# Patient Record
Sex: Male | Born: 1945 | Race: Black or African American | Hispanic: No
Health system: Midwestern US, Academic
[De-identification: ages and names within clinical notes are randomized; demographics above are authoritative.]

---

## 2016-02-12 IMAGING — CR CHEST
1 series · 1 of 1 positions shown · non-contrast
Comparison: none

[chest port x-wise]
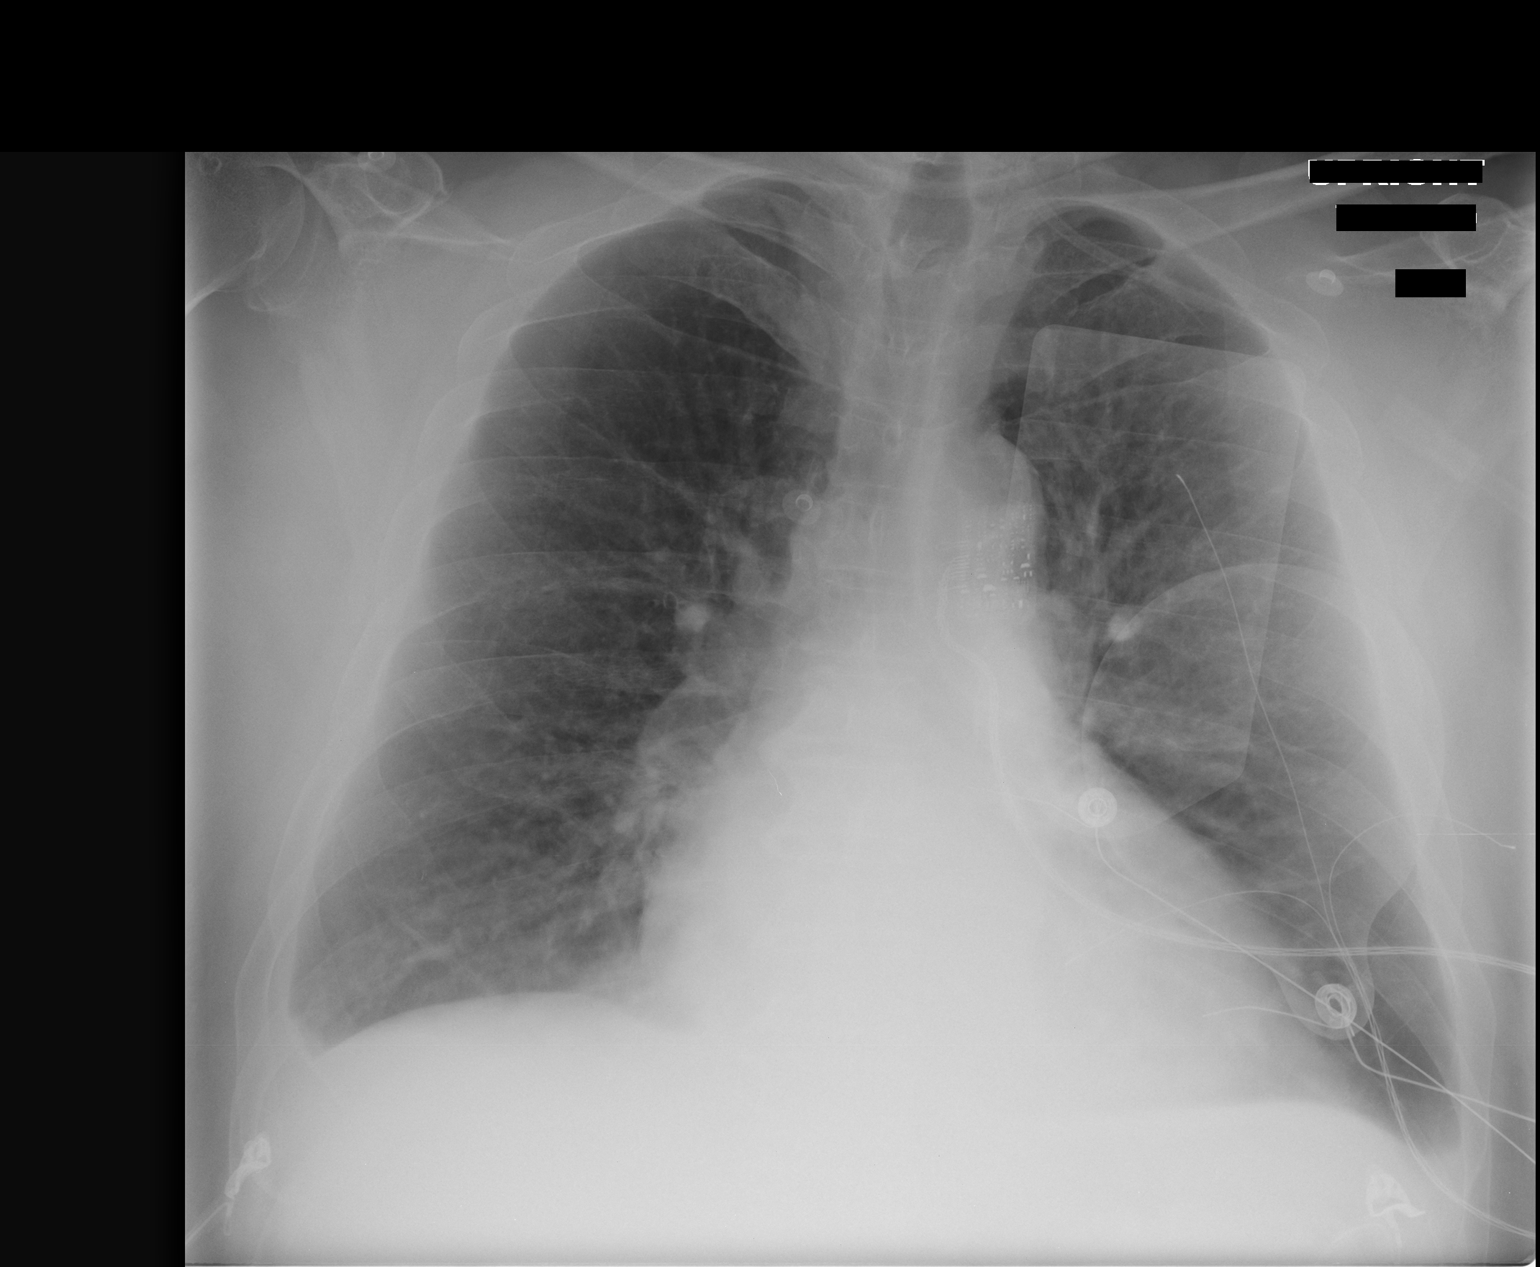

[1 of 1 positions shown; findings below may reference images not displayed]

DIAGNOSTIC STUDIES

EXAM
SINGLE VIEW CHEST

INDICATION
Vtach, Pleural effusions
AED PADS ON PT. VTACH. PLEURAL EFFUSION. TM

TECHNIQUE
Single frontal view

COMPARISON
Yesterday's chest radiographs

FINDINGS
Cardiomediastinal silhouette and pulmonary vasculature are unchanged. Pacer pads overlie the left
hemi thorax. Small bilateral pleural effusions are unchanged. No pneumothorax is identified.

IMPRESSION
Unchanged appearance of the chest including small bilateral pleural effusions with adjacent
atelectasis and/or pneumonitis.

## 2017-03-02 MED ORDER — FUROSEMIDE 20 MG PO TAB
ORAL_TABLET | ORAL | 3 refills | 90.00000 days | Status: DC
Start: 2017-03-02 — End: 2018-04-18

## 2017-03-15 MED ORDER — CARVEDILOL 25 MG PO TAB
ORAL_TABLET | Freq: Two times a day (BID) | ORAL | 3 refills | 90.00000 days | Status: DC
Start: 2017-03-15 — End: 2018-03-23

## 2017-04-06 ENCOUNTER — Ambulatory Visit: Admit: 2017-04-06 | Discharge: 2017-04-07 | Payer: MEDICARE

## 2017-04-06 ENCOUNTER — Encounter: Admit: 2017-04-06 | Discharge: 2017-04-06 | Payer: MEDICARE

## 2017-04-06 DIAGNOSIS — I472 Ventricular tachycardia: Principal | ICD-10-CM

## 2017-04-06 DIAGNOSIS — E119 Type 2 diabetes mellitus without complications: ICD-10-CM

## 2017-04-06 DIAGNOSIS — M1990 Unspecified osteoarthritis, unspecified site: ICD-10-CM

## 2017-04-06 DIAGNOSIS — K219 Gastro-esophageal reflux disease without esophagitis: ICD-10-CM

## 2017-04-06 DIAGNOSIS — I2510 Atherosclerotic heart disease of native coronary artery without angina pectoris: ICD-10-CM

## 2017-04-06 DIAGNOSIS — I10 Essential (primary) hypertension: Principal | ICD-10-CM

## 2017-04-06 DIAGNOSIS — I509 Heart failure, unspecified: ICD-10-CM

## 2017-04-06 DIAGNOSIS — N400 Enlarged prostate without lower urinary tract symptoms: ICD-10-CM

## 2017-04-06 DIAGNOSIS — E7800 Pure hypercholesterolemia, unspecified: ICD-10-CM

## 2017-04-06 DIAGNOSIS — Z95810 Presence of automatic (implantable) cardiac defibrillator: ICD-10-CM

## 2017-04-06 DIAGNOSIS — G4733 Obstructive sleep apnea (adult) (pediatric): ICD-10-CM

## 2017-04-06 DIAGNOSIS — Z720 Tobacco use: ICD-10-CM

## 2017-04-06 DIAGNOSIS — J449 Chronic obstructive pulmonary disease, unspecified: ICD-10-CM

## 2017-04-06 DIAGNOSIS — E118 Type 2 diabetes mellitus with unspecified complications: ICD-10-CM

## 2017-04-06 DIAGNOSIS — B20 Human immunodeficiency virus [HIV] disease: ICD-10-CM

## 2017-04-14 ENCOUNTER — Encounter: Admit: 2017-04-14 | Discharge: 2017-04-14 | Payer: MEDICARE

## 2017-04-14 MED ORDER — SPIRONOLACTONE 25 MG PO TAB
ORAL_TABLET | Freq: Every day | ORAL | 3 refills | 90.00000 days | Status: AC
Start: 2017-04-14 — End: 2018-04-11

## 2017-05-23 LAB — COMPREHENSIVE METABOLIC PANEL
Lab: 1
Lab: 14
Lab: 141
Lab: 15 — ABNORMAL HIGH (ref 0–14)
Lab: 24
Lab: 3.9
Lab: 51
Lab: 58
Lab: 7.8
Lab: 87

## 2017-05-23 LAB — LIPID PROFILE
Lab: 114 — ABNORMAL LOW (ref 150–200)
Lab: 28 — ABNORMAL HIGH (ref 0.72–1.25)
Lab: 3
Lab: 50 — ABNORMAL LOW (ref 27.0–31.0)

## 2017-05-23 LAB — THYROID STIMULATING HORMONE-TSH: Lab: 1 — ABNORMAL HIGH (ref 98–107)

## 2017-05-23 LAB — CBC: Lab: 6.6

## 2017-05-23 LAB — HEMOGLOBIN A1C: Lab: 10 — ABNORMAL HIGH (ref 4.5–6.5)

## 2017-06-02 ENCOUNTER — Ambulatory Visit: Admit: 2017-06-02 | Discharge: 2017-06-03 | Payer: MEDICARE

## 2017-06-03 DIAGNOSIS — Z95810 Presence of automatic (implantable) cardiac defibrillator: ICD-10-CM

## 2017-06-03 DIAGNOSIS — I472 Ventricular tachycardia: Principal | ICD-10-CM

## 2017-06-07 ENCOUNTER — Encounter: Admit: 2017-06-07 | Discharge: 2017-06-07 | Payer: MEDICARE

## 2017-06-07 DIAGNOSIS — Z95810 Presence of automatic (implantable) cardiac defibrillator: ICD-10-CM

## 2017-06-07 DIAGNOSIS — I472 Ventricular tachycardia: Principal | ICD-10-CM

## 2017-09-01 ENCOUNTER — Ambulatory Visit: Admit: 2017-09-01 | Discharge: 2017-09-02 | Payer: MEDICARE

## 2017-09-02 DIAGNOSIS — Z95810 Presence of automatic (implantable) cardiac defibrillator: ICD-10-CM

## 2017-09-02 DIAGNOSIS — I472 Ventricular tachycardia: Principal | ICD-10-CM

## 2017-09-13 ENCOUNTER — Encounter: Admit: 2017-09-13 | Discharge: 2017-09-13 | Payer: MEDICARE

## 2017-09-13 NOTE — Telephone Encounter
-----   Message from Foye Deer, [place] sent at 09/13/2017 12:29 PM CST -----  Regarding: YMR - Corvue shows possible fluid overload.   [09/13/2017 12:28:45 PM - WISE, BRANDON]  Please see scanned data sheets for further review.  Scheduled MERLIN transmission received for  dual chamber ICD. Device function appears appropriate.    Presenting EGM shows APVS 60 bpm on 09/01/17. Battery longevity 5.7-6.9 years.    Events noted since 04/06/17:  Atrial:  None.     Ventricular:  None.    CorVue shows possible fluid accumulation and/or possible early heart failure.    Next follow up appt 09/2017.  Next remote scheduled for 3 mo.   Results routed to Dr. Reece Levy for signature and review.

## 2017-09-13 NOTE — Telephone Encounter
Received alert for possible fluid accumulation (see note below). No recent episodes of VT.    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 Patient had improved EF from 20% to 45% in June 2017.     Patient is on Coreg 25mg  BID, lasix 20mg  M/W/F, Losartan 25mg , and spironolactone 12.5mg  tab. Patient states he has been compliant with medications. 02/08/17 K+ was 4.1.    The patient denies any associated symptoms including SOB, chest pain, ankle swelling, or fatigue. The patient states he does not have a scale at home so is unsure of his current weight. The patient was advised to buy a scale for home and monitor weights daily. Educated that a gain of 3lbs in one day or 5lbs a week is an indicator that he's holding on to fluid. The patient stated understanding.    Routed to [facility] for review.

## 2017-09-15 NOTE — Telephone Encounter
Reviewed with YMR. Patient is asymptomatic, no concerns or recommendations at this time.

## 2017-09-18 ENCOUNTER — Encounter: Admit: 2017-09-18 | Discharge: 2017-09-18 | Payer: MEDICARE

## 2017-09-19 MED ORDER — ATORVASTATIN 40 MG PO TAB
ORAL_TABLET | Freq: Every day | 3 refills | Status: AC
Start: 2017-09-19 — End: 2018-07-19

## 2017-11-10 ENCOUNTER — Encounter: Admit: 2017-11-10 | Discharge: 2017-11-10 | Payer: MEDICARE

## 2017-11-11 MED ORDER — LOSARTAN 25 MG PO TAB
ORAL_TABLET | Freq: Every day | ORAL | 3 refills | 30.00000 days | Status: AC
Start: 2017-11-11 — End: 2018-10-05

## 2017-11-30 ENCOUNTER — Encounter: Admit: 2017-11-30 | Discharge: 2017-11-30 | Payer: MEDICARE

## 2017-12-02 ENCOUNTER — Ambulatory Visit: Admit: 2017-12-02 | Discharge: 2017-12-03 | Payer: MEDICARE

## 2017-12-03 DIAGNOSIS — I472 Ventricular tachycardia: Principal | ICD-10-CM

## 2017-12-03 DIAGNOSIS — Z95810 Presence of automatic (implantable) cardiac defibrillator: ICD-10-CM

## 2017-12-13 ENCOUNTER — Ambulatory Visit: Admit: 2017-12-13 | Discharge: 2017-12-14 | Payer: MEDICARE

## 2017-12-13 ENCOUNTER — Encounter: Admit: 2017-12-13 | Discharge: 2017-12-13 | Payer: MEDICARE

## 2017-12-13 DIAGNOSIS — B20 Human immunodeficiency virus [HIV] disease: ICD-10-CM

## 2017-12-13 DIAGNOSIS — Z95810 Presence of automatic (implantable) cardiac defibrillator: ICD-10-CM

## 2017-12-13 DIAGNOSIS — I509 Heart failure, unspecified: ICD-10-CM

## 2017-12-13 DIAGNOSIS — G4733 Obstructive sleep apnea (adult) (pediatric): ICD-10-CM

## 2017-12-13 DIAGNOSIS — M1990 Unspecified osteoarthritis, unspecified site: ICD-10-CM

## 2017-12-13 DIAGNOSIS — K219 Gastro-esophageal reflux disease without esophagitis: ICD-10-CM

## 2017-12-13 DIAGNOSIS — N183 Chronic kidney disease, stage 3 (moderate): ICD-10-CM

## 2017-12-13 DIAGNOSIS — I5042 Chronic combined systolic (congestive) and diastolic (congestive) heart failure: ICD-10-CM

## 2017-12-13 DIAGNOSIS — I10 Essential (primary) hypertension: ICD-10-CM

## 2017-12-13 DIAGNOSIS — I2510 Atherosclerotic heart disease of native coronary artery without angina pectoris: Principal | ICD-10-CM

## 2017-12-13 DIAGNOSIS — J449 Chronic obstructive pulmonary disease, unspecified: ICD-10-CM

## 2017-12-13 DIAGNOSIS — E119 Type 2 diabetes mellitus without complications: ICD-10-CM

## 2017-12-13 DIAGNOSIS — N400 Enlarged prostate without lower urinary tract symptoms: ICD-10-CM

## 2017-12-13 LAB — BASIC METABOLIC PANEL
Lab: 1.6 — ABNORMAL HIGH (ref 0.72–1.25)
Lab: 10 mL/min — ABNORMAL HIGH (ref 8.8–10)
Lab: 109 % — ABNORMAL HIGH (ref 98–107)
Lab: 138 % (ref 48–68)
Lab: 14 % (ref 9–21)
Lab: 16 % — ABNORMAL LOW (ref 23–31)
Lab: 4.3 % (ref 2–6)
Lab: 96 mL/min (ref >60)

## 2017-12-16 ENCOUNTER — Encounter: Admit: 2017-12-16 | Discharge: 2017-12-16 | Payer: MEDICARE

## 2017-12-16 DIAGNOSIS — I10 Essential (primary) hypertension: Principal | ICD-10-CM

## 2017-12-16 DIAGNOSIS — I5042 Chronic combined systolic (congestive) and diastolic (congestive) heart failure: ICD-10-CM

## 2017-12-20 LAB — BASIC METABOLIC PANEL
Lab: 109 MMOL/L — ABNORMAL HIGH (ref 98–107)
Lab: 140 U/L (ref 25–110)
Lab: 4.3 U/L (ref 7–40)

## 2017-12-21 ENCOUNTER — Encounter: Admit: 2017-12-21 | Discharge: 2017-12-21 | Payer: MEDICARE

## 2017-12-21 DIAGNOSIS — I10 Essential (primary) hypertension: Principal | ICD-10-CM

## 2017-12-21 DIAGNOSIS — I5042 Chronic combined systolic (congestive) and diastolic (congestive) heart failure: ICD-10-CM

## 2018-01-05 ENCOUNTER — Encounter: Admit: 2018-01-05 | Discharge: 2018-01-05 | Payer: MEDICARE

## 2018-01-05 DIAGNOSIS — I10 Essential (primary) hypertension: Principal | ICD-10-CM

## 2018-01-05 DIAGNOSIS — N183 Chronic kidney disease, stage 3 (moderate): ICD-10-CM

## 2018-01-06 LAB — BASIC METABOLIC PANEL
Lab: 10 % — ABNORMAL HIGH (ref 8.8–10.0)
Lab: 106 FL (ref 7–11)
Lab: 140 % (ref 11–15)
Lab: 16 % — ABNORMAL LOW (ref 24–44)
Lab: 18 10*3/uL — ABNORMAL HIGH (ref 0–14)
Lab: 20 % — ABNORMAL LOW (ref 23–31)
Lab: 4.4 K/UL (ref 150–400)
Lab: 86 % — ABNORMAL LOW (ref >60)

## 2018-01-09 ENCOUNTER — Encounter: Admit: 2018-01-09 | Discharge: 2018-01-09 | Payer: MEDICARE

## 2018-01-09 DIAGNOSIS — N183 Chronic kidney disease, stage 3 (moderate): ICD-10-CM

## 2018-01-09 DIAGNOSIS — I10 Essential (primary) hypertension: Principal | ICD-10-CM

## 2018-01-19 ENCOUNTER — Encounter: Admit: 2018-01-19 | Discharge: 2018-01-19 | Payer: MEDICARE

## 2018-01-19 DIAGNOSIS — G4733 Obstructive sleep apnea (adult) (pediatric): ICD-10-CM

## 2018-01-19 DIAGNOSIS — N400 Enlarged prostate without lower urinary tract symptoms: ICD-10-CM

## 2018-01-19 DIAGNOSIS — J449 Chronic obstructive pulmonary disease, unspecified: ICD-10-CM

## 2018-01-19 DIAGNOSIS — I509 Heart failure, unspecified: ICD-10-CM

## 2018-01-19 DIAGNOSIS — M1990 Unspecified osteoarthritis, unspecified site: ICD-10-CM

## 2018-01-19 DIAGNOSIS — B20 Human immunodeficiency virus [HIV] disease: ICD-10-CM

## 2018-01-19 DIAGNOSIS — K219 Gastro-esophageal reflux disease without esophagitis: ICD-10-CM

## 2018-01-19 DIAGNOSIS — E119 Type 2 diabetes mellitus without complications: ICD-10-CM

## 2018-01-19 DIAGNOSIS — I10 Essential (primary) hypertension: Principal | ICD-10-CM

## 2018-02-28 ENCOUNTER — Encounter: Admit: 2018-02-28 | Discharge: 2018-02-28 | Payer: MEDICARE

## 2018-03-03 ENCOUNTER — Ambulatory Visit: Admit: 2018-03-03 | Discharge: 2018-03-04 | Payer: MEDICARE

## 2018-03-04 DIAGNOSIS — I472 Ventricular tachycardia: Principal | ICD-10-CM

## 2018-03-04 DIAGNOSIS — Z95810 Presence of automatic (implantable) cardiac defibrillator: ICD-10-CM

## 2018-03-09 ENCOUNTER — Encounter: Admit: 2018-03-09 | Discharge: 2018-03-09 | Payer: MEDICARE

## 2018-03-14 ENCOUNTER — Encounter: Admit: 2018-03-14 | Discharge: 2018-03-14 | Payer: MEDICARE

## 2018-03-14 ENCOUNTER — Ambulatory Visit: Admit: 2018-03-14 | Discharge: 2018-03-15 | Payer: MEDICARE

## 2018-03-14 ENCOUNTER — Ambulatory Visit: Admit: 2018-03-14 | Discharge: 2018-03-14 | Payer: MEDICARE

## 2018-03-14 DIAGNOSIS — I10 Essential (primary) hypertension: Principal | ICD-10-CM

## 2018-03-14 DIAGNOSIS — I5042 Chronic combined systolic (congestive) and diastolic (congestive) heart failure: ICD-10-CM

## 2018-03-14 DIAGNOSIS — I509 Heart failure, unspecified: ICD-10-CM

## 2018-03-14 DIAGNOSIS — M1990 Unspecified osteoarthritis, unspecified site: ICD-10-CM

## 2018-03-14 DIAGNOSIS — E119 Type 2 diabetes mellitus without complications: ICD-10-CM

## 2018-03-14 DIAGNOSIS — G4733 Obstructive sleep apnea (adult) (pediatric): ICD-10-CM

## 2018-03-14 DIAGNOSIS — N183 Chronic kidney disease, stage 3 (moderate): ICD-10-CM

## 2018-03-14 DIAGNOSIS — B20 Human immunodeficiency virus [HIV] disease: ICD-10-CM

## 2018-03-14 DIAGNOSIS — J449 Chronic obstructive pulmonary disease, unspecified: ICD-10-CM

## 2018-03-14 DIAGNOSIS — I472 Ventricular tachycardia: ICD-10-CM

## 2018-03-14 DIAGNOSIS — Z95810 Presence of automatic (implantable) cardiac defibrillator: ICD-10-CM

## 2018-03-14 DIAGNOSIS — E7800 Pure hypercholesterolemia, unspecified: ICD-10-CM

## 2018-03-14 DIAGNOSIS — K219 Gastro-esophageal reflux disease without esophagitis: ICD-10-CM

## 2018-03-14 DIAGNOSIS — N400 Enlarged prostate without lower urinary tract symptoms: ICD-10-CM

## 2018-03-22 ENCOUNTER — Ambulatory Visit: Admit: 2018-03-22 | Discharge: 2018-03-23 | Payer: MEDICARE

## 2018-03-22 DIAGNOSIS — I10 Essential (primary) hypertension: Principal | ICD-10-CM

## 2018-03-22 DIAGNOSIS — I472 Ventricular tachycardia: ICD-10-CM

## 2018-03-23 ENCOUNTER — Encounter: Admit: 2018-03-23 | Discharge: 2018-03-23 | Payer: MEDICARE

## 2018-03-23 MED ORDER — CARVEDILOL 25 MG PO TAB
ORAL_TABLET | Freq: Two times a day (BID) | ORAL | 3 refills | 90.00000 days | Status: AC
Start: 2018-03-23 — End: 2019-03-26

## 2018-04-11 ENCOUNTER — Encounter: Admit: 2018-04-11 | Discharge: 2018-04-11 | Payer: MEDICARE

## 2018-04-11 ENCOUNTER — Ambulatory Visit: Admit: 2018-04-11 | Discharge: 2018-04-12 | Payer: MEDICARE

## 2018-04-11 DIAGNOSIS — K219 Gastro-esophageal reflux disease without esophagitis: ICD-10-CM

## 2018-04-11 DIAGNOSIS — I472 Ventricular tachycardia: ICD-10-CM

## 2018-04-11 DIAGNOSIS — I10 Essential (primary) hypertension: Principal | ICD-10-CM

## 2018-04-11 DIAGNOSIS — B20 Human immunodeficiency virus [HIV] disease: ICD-10-CM

## 2018-04-11 DIAGNOSIS — J449 Chronic obstructive pulmonary disease, unspecified: ICD-10-CM

## 2018-04-11 DIAGNOSIS — I5042 Chronic combined systolic (congestive) and diastolic (congestive) heart failure: Principal | ICD-10-CM

## 2018-04-11 DIAGNOSIS — N400 Enlarged prostate without lower urinary tract symptoms: ICD-10-CM

## 2018-04-11 DIAGNOSIS — Z95810 Presence of automatic (implantable) cardiac defibrillator: ICD-10-CM

## 2018-04-11 DIAGNOSIS — E119 Type 2 diabetes mellitus without complications: ICD-10-CM

## 2018-04-11 DIAGNOSIS — I509 Heart failure, unspecified: ICD-10-CM

## 2018-04-11 DIAGNOSIS — M1990 Unspecified osteoarthritis, unspecified site: ICD-10-CM

## 2018-04-11 DIAGNOSIS — G4733 Obstructive sleep apnea (adult) (pediatric): ICD-10-CM

## 2018-04-11 MED ORDER — SPIRONOLACTONE 25 MG PO TAB
ORAL_TABLET | Freq: Every day | ORAL | 3 refills | 46.00000 days | Status: AC
Start: 2018-04-11 — End: 2018-10-05

## 2018-04-18 ENCOUNTER — Encounter: Admit: 2018-04-18 | Discharge: 2018-04-18 | Payer: MEDICARE

## 2018-04-18 MED ORDER — FUROSEMIDE 20 MG PO TAB
ORAL_TABLET | ORAL | 3 refills | 90.00000 days | Status: AC
Start: 2018-04-18 — End: 2018-10-05

## 2018-06-06 ENCOUNTER — Ambulatory Visit: Admit: 2018-06-06 | Discharge: 2018-06-07 | Payer: MEDICARE

## 2018-06-06 ENCOUNTER — Encounter: Admit: 2018-06-06 | Discharge: 2018-06-06 | Payer: MEDICARE

## 2018-06-06 DIAGNOSIS — Z95810 Presence of automatic (implantable) cardiac defibrillator: ICD-10-CM

## 2018-06-06 DIAGNOSIS — I5042 Chronic combined systolic (congestive) and diastolic (congestive) heart failure: Principal | ICD-10-CM

## 2018-06-06 DIAGNOSIS — I472 Ventricular tachycardia: ICD-10-CM

## 2018-06-07 DIAGNOSIS — I5042 Chronic combined systolic (congestive) and diastolic (congestive) heart failure: Principal | ICD-10-CM

## 2018-06-07 DIAGNOSIS — I472 Ventricular tachycardia: ICD-10-CM

## 2018-07-19 ENCOUNTER — Encounter: Admit: 2018-07-19 | Discharge: 2018-07-19 | Payer: MEDICARE

## 2018-07-19 DIAGNOSIS — E7800 Pure hypercholesterolemia, unspecified: Principal | ICD-10-CM

## 2018-07-19 DIAGNOSIS — I2510 Atherosclerotic heart disease of native coronary artery without angina pectoris: ICD-10-CM

## 2018-07-19 MED ORDER — ATORVASTATIN 40 MG PO TAB
ORAL_TABLET | Freq: Every day | 3 refills | Status: SS
Start: 2018-07-19 — End: 2019-06-19

## 2018-07-28 ENCOUNTER — Encounter: Admit: 2018-07-28 | Discharge: 2018-07-28 | Payer: MEDICARE

## 2018-07-28 DIAGNOSIS — E7800 Pure hypercholesterolemia, unspecified: Principal | ICD-10-CM

## 2018-07-28 DIAGNOSIS — I2510 Atherosclerotic heart disease of native coronary artery without angina pectoris: ICD-10-CM

## 2018-07-28 LAB — LIPID PROFILE
Lab: 19
Lab: 111 — ABNORMAL LOW (ref 150–200)
Lab: 3
Lab: 38
Lab: 52
Lab: 95

## 2018-07-28 LAB — LIVER FUNCTION PANEL: Lab: 1.1 mg/dL — ABNORMAL HIGH (ref 0.0–1.0)

## 2018-09-05 ENCOUNTER — Ambulatory Visit: Admit: 2018-09-05 | Discharge: 2018-09-05 | Payer: MEDICARE

## 2018-09-05 DIAGNOSIS — I472 Ventricular tachycardia: Secondary | ICD-10-CM

## 2018-09-05 DIAGNOSIS — I5042 Chronic combined systolic (congestive) and diastolic (congestive) heart failure: Principal | ICD-10-CM

## 2018-09-05 DIAGNOSIS — Z95810 Presence of automatic (implantable) cardiac defibrillator: ICD-10-CM

## 2018-09-25 ENCOUNTER — Encounter: Admit: 2018-09-25 | Discharge: 2018-09-25 | Payer: MEDICARE

## 2018-09-25 DIAGNOSIS — K859 Acute pancreatitis without necrosis or infection, unspecified: Secondary | ICD-10-CM

## 2018-09-25 LAB — POC GLUCOSE: Lab: 162 mg/dL — ABNORMAL HIGH (ref 70–100)

## 2018-09-25 MED ORDER — LACTATED RINGERS IV SOLP
1000 mL | Freq: Once | INTRAVENOUS | 0 refills | Status: CP
Start: 2018-09-25 — End: ?
  Administered 2018-09-26: 01:00:00 1000 mL via INTRAVENOUS

## 2018-09-25 MED ORDER — LACTATED RINGERS IV SOLP
1000 mL | INTRAVENOUS | 0 refills | Status: DC
Start: 2018-09-25 — End: 2018-09-26
  Administered 2018-09-26: 08:00:00 1000 mL via INTRAVENOUS

## 2018-09-25 MED ORDER — FAMOTIDINE (PF) 20 MG/2 ML IV SOLN
20 mg | Freq: Two times a day (BID) | INTRAVENOUS | 0 refills | Status: DC
Start: 2018-09-25 — End: 2018-09-26
  Administered 2018-09-26 (×2): 20 mg via INTRAVENOUS

## 2018-09-25 MED ORDER — FENTANYL CITRATE (PF) 50 MCG/ML IJ SOLN
25 ug | INTRAVENOUS | 0 refills | Status: DC | PRN
Start: 2018-09-25 — End: 2018-09-30
  Administered 2018-09-26 – 2018-09-30 (×3): 25 ug via INTRAVENOUS

## 2018-09-25 MED ORDER — BISACODYL 10 MG RE SUPP
10 mg | Freq: Every day | RECTAL | 0 refills | Status: DC | PRN
Start: 2018-09-25 — End: 2018-10-05
  Administered 2018-10-01 (×2): 10 mg via RECTAL

## 2018-09-25 MED ORDER — INSULIN ASPART 100 UNIT/ML SC FLEXPEN
0-12 [IU] | Freq: Before meals | SUBCUTANEOUS | 0 refills | Status: DC
Start: 2018-09-25 — End: 2018-10-05
  Administered 2018-09-27: 17:00:00 2 [IU] via SUBCUTANEOUS

## 2018-09-26 ENCOUNTER — Encounter: Admit: 2018-09-26 | Discharge: 2018-09-26 | Payer: MEDICARE

## 2018-09-26 ENCOUNTER — Inpatient Hospital Stay: Admit: 2018-09-26 | Discharge: 2018-09-26 | Payer: MEDICARE

## 2018-09-26 ENCOUNTER — Encounter: Admit: 2018-09-26 | Discharge: 2018-09-27 | Payer: MEDICARE

## 2018-09-26 DIAGNOSIS — B20 Human immunodeficiency virus [HIV] disease: ICD-10-CM

## 2018-09-26 DIAGNOSIS — R945 Abnormal results of liver function studies: ICD-10-CM

## 2018-09-26 DIAGNOSIS — N400 Enlarged prostate without lower urinary tract symptoms: ICD-10-CM

## 2018-09-26 DIAGNOSIS — M1990 Unspecified osteoarthritis, unspecified site: ICD-10-CM

## 2018-09-26 DIAGNOSIS — J449 Chronic obstructive pulmonary disease, unspecified: ICD-10-CM

## 2018-09-26 DIAGNOSIS — I10 Essential (primary) hypertension: Principal | ICD-10-CM

## 2018-09-26 DIAGNOSIS — E119 Type 2 diabetes mellitus without complications: ICD-10-CM

## 2018-09-26 DIAGNOSIS — K219 Gastro-esophageal reflux disease without esophagitis: ICD-10-CM

## 2018-09-26 DIAGNOSIS — I509 Heart failure, unspecified: ICD-10-CM

## 2018-09-26 DIAGNOSIS — G4733 Obstructive sleep apnea (adult) (pediatric): ICD-10-CM

## 2018-09-26 LAB — BASIC METABOLIC PANEL
Lab: 107 MMOL/L (ref 98–110)
Lab: 12 MMOL/L — ABNORMAL LOW (ref 21–30)
Lab: 121 mg/dL — ABNORMAL HIGH (ref 70–100)
Lab: 13 mg/dL — ABNORMAL HIGH (ref 0.4–1.24)
Lab: 135 MMOL/L — ABNORMAL LOW (ref 137–147)
Lab: 16 — ABNORMAL HIGH (ref 3–12)
Lab: 5.8 MMOL/L — ABNORMAL HIGH (ref 3.5–5.1)
Lab: 8.4 mg/dL — ABNORMAL LOW (ref 8.5–10.6)
Lab: 88 mg/dL — ABNORMAL HIGH (ref 7–25)
Lab: 135 MMOL/L — ABNORMAL LOW (ref 137–147)
Lab: 136 MMOL/L — ABNORMAL LOW (ref >60)

## 2018-09-26 LAB — COMPREHENSIVE METABOLIC PANEL
Lab: 11 MMOL/L — ABNORMAL LOW (ref 21–30)
Lab: 115 U/L — ABNORMAL HIGH (ref 25–110)
Lab: 13 mg/dL — ABNORMAL HIGH (ref 0.4–1.24)
Lab: 132 U/L — ABNORMAL HIGH (ref 7–40)
Lab: 134 MMOL/L — ABNORMAL LOW (ref 137–147)
Lab: 135 U/L — ABNORMAL HIGH (ref 7–56)
Lab: 157 mg/dL — ABNORMAL HIGH (ref 70–100)
Lab: 3.1 g/dL — ABNORMAL LOW (ref 3.5–5.0)
Lab: 4 mL/min — ABNORMAL LOW (ref >60)
Lab: 6.9 mg/dL — ABNORMAL HIGH (ref 0.3–1.2)
Lab: 7.6 g/dL (ref 6.0–8.0)
Lab: 8.8 mg/dL (ref 8.5–10.6)
Lab: 90 mg/dL — ABNORMAL HIGH (ref 7–25)
Lab: 18 — ABNORMAL HIGH (ref 3–12)
Lab: 135 MMOL/L — ABNORMAL LOW (ref <150)

## 2018-09-26 LAB — CBC AND DIFF
Lab: 1 % (ref 0–5)
Lab: 10 % (ref 4–12)
Lab: 10 10*3/uL — ABNORMAL HIGH (ref 1.8–7.0)
Lab: 10 g/dL — ABNORMAL LOW (ref 13.5–16.5)
Lab: 12 K/UL — ABNORMAL HIGH (ref 4.5–11.0)
Lab: 3.8 M/UL — ABNORMAL LOW (ref 4.4–5.5)
Lab: 83 % — ABNORMAL HIGH (ref 41–77)
Lab: 11 10*3/uL — ABNORMAL HIGH (ref 4.5–11.0)

## 2018-09-26 LAB — POC GLUCOSE
Lab: 121 mg/dL — ABNORMAL HIGH (ref 70–100)
Lab: 140 mg/dL — ABNORMAL HIGH (ref 70–100)
Lab: 147 mg/dL — ABNORMAL HIGH (ref 70–100)
Lab: 238 mg/dL — ABNORMAL HIGH (ref 70–100)

## 2018-09-26 LAB — BLOOD GASES, ARTERIAL
Lab: 25 mmHg — ABNORMAL LOW (ref 35–45)
Lab: 7.3 — ABNORMAL LOW (ref 7.35–7.45)
Lab: 87 mmHg — AB (ref 80–100)
Lab: 13 MMOL/L — ABNORMAL LOW (ref 21–28)
Lab: 14 MMOL/L
Lab: 40 mmHg (ref 35–45)
Lab: 7.1 — CL (ref 7.35–7.45)
Lab: 84 mmHg (ref 80–100)
Lab: 94 % — ABNORMAL LOW (ref 95–99)
Lab: 14 MMOL/L — ABNORMAL HIGH (ref 3.4–5.0)
Lab: 41 mmHg (ref 35–45)
Lab: 7.1 % — CL (ref 7.35–7.45)
Lab: 90 mmHg (ref 80–100)
Lab: 95 % (ref 95–99)

## 2018-09-26 LAB — POC IONIZED CALCIUM: Lab: 1.2 MMOL/L (ref 1.0–1.3)

## 2018-09-26 LAB — LIPASE
Lab: 482 U/L — ABNORMAL HIGH (ref 11–82)
Lab: 595 U/L — ABNORMAL HIGH (ref <100)

## 2018-09-26 LAB — BETA HYDROXYBUTYRATE (KETONES): Lab: 0.4 MMOL/L — ABNORMAL HIGH (ref <0.3)

## 2018-09-26 LAB — TROPONIN-I: Lab: 0 ng/mL — ABNORMAL LOW (ref 0.0–0.05)

## 2018-09-26 LAB — POC BLOOD GAS ARTERIAL
Lab: 18 MMOL/L — ABNORMAL LOW (ref 21–28)
Lab: 6.9 — CL (ref 7.35–7.45)
Lab: 85 % — ABNORMAL LOW (ref 95–99)
Lab: 88 mmHg — ABNORMAL HIGH (ref 35–45)

## 2018-09-26 LAB — CEA(CARCINOEMBRYONIC AG): Lab: 2.5 ng/mL (ref <3.0)

## 2018-09-26 LAB — LACTIC ACID (BG - RAPID LACTATE)
Lab: 0.9 MMOL/L (ref 0.5–2.0)
Lab: 0.6 MMOL/L — ABNORMAL HIGH (ref >60)

## 2018-09-26 LAB — POC HEMATOCRIT
Lab: 10 g/dL — ABNORMAL LOW (ref 13.5–16.5)
Lab: 32 % — ABNORMAL LOW (ref 40–50)

## 2018-09-26 LAB — OSMOLALITY: Lab: 320 mosm/kg — ABNORMAL HIGH (ref 280–307)

## 2018-09-26 LAB — URINALYSIS, MICROSCOPIC

## 2018-09-26 LAB — POC SODIUM: Lab: 138 MMOL/L (ref 137–147)

## 2018-09-26 LAB — PROTIME INR (PT): Lab: 1.2 (ref 0.8–1.2)

## 2018-09-26 LAB — CA19.9: Lab: 115 U/mL — ABNORMAL HIGH (ref <35)

## 2018-09-26 LAB — TRIGLYCERIDE: Lab: 287 mg/dL — ABNORMAL HIGH (ref <150)

## 2018-09-26 LAB — PROSTATIC SPECIFIC ANTIGEN-PSA: Lab: 18 ng/mL — ABNORMAL HIGH (ref <6.01)

## 2018-09-26 LAB — OSMOLALITY-URINE RANDOM: Lab: 328 mosm/kg (ref 50–1400)

## 2018-09-26 LAB — SODIUM-URINE RANDOM: Lab: 54 MMOL/L

## 2018-09-26 LAB — PTT (APTT): Lab: 33 s (ref 24.0–36.5)

## 2018-09-26 LAB — URINALYSIS DIPSTICK

## 2018-09-26 LAB — POC POTASSIUM: Lab: 5.9 MMOL/L — ABNORMAL HIGH (ref 3.5–5.1)

## 2018-09-26 MED ORDER — HEPARIN, PORCINE (PF) 5,000 UNIT/0.5 ML IJ SYRG
5000 [IU] | SUBCUTANEOUS | 0 refills | Status: DC
Start: 2018-09-26 — End: 2018-10-01
  Administered 2018-09-26 – 2018-10-01 (×14): 5000 [IU] via SUBCUTANEOUS

## 2018-09-26 MED ORDER — SODIUM BICARBONATE 1MEQ/ML IVPB
50 meq | Freq: Once | INTRAVENOUS | 0 refills | Status: DC
Start: 2018-09-26 — End: 2018-09-26

## 2018-09-26 MED ORDER — DEXTROSE 50 % IN WATER (D50W) IV SOLP
50 mL | Freq: Once | INTRAVENOUS | 0 refills | Status: AC
Start: 2018-09-26 — End: ?

## 2018-09-26 MED ORDER — IOPAMIDOL 61 % 50 ML IV SOLN (OT)(OSM)
0 refills | Status: DC
Start: 2018-09-26 — End: 2018-09-26
  Administered 2018-09-26: 17:00:00 50 mL via INTRAMUSCULAR

## 2018-09-26 MED ORDER — GLYCOPYRROLATE 0.2 MG/ML IJ SOLN
0 refills | Status: DC
Start: 2018-09-26 — End: 2018-09-26
  Administered 2018-09-26: 17:00:00 0.6 mg via INTRAVENOUS

## 2018-09-26 MED ORDER — SODIUM CHLORIDE 0.9 % IV SOLP
200 mL | INTRAVENOUS | 0 refills | Status: DC | PRN
Start: 2018-09-26 — End: 2018-09-27

## 2018-09-26 MED ORDER — SODIUM CHLORIDE 0.9 % IV SOLP
1000 mL | INTRAVENOUS | 0 refills | Status: DC | PRN
Start: 2018-09-26 — End: 2018-09-27

## 2018-09-26 MED ORDER — SODIUM CHLORIDE 0.9 % IV SOLP
500 mL | Freq: Once | INTRAVENOUS | 0 refills | Status: CP
Start: 2018-09-26 — End: ?
  Administered 2018-09-26: 14:00:00 500 mL via INTRAVENOUS

## 2018-09-26 MED ORDER — PROPOFOL 10 MG/ML IV EMUL
5-150 ug/kg/min | INTRAVENOUS | 0 refills | Status: DC
Start: 2018-09-26 — End: 2018-09-27
  Administered 2018-09-26: 15 ug/kg/min via INTRAVENOUS
  Administered 2018-09-26: 18:00:00 5 ug/kg/min via INTRAVENOUS
  Administered 2018-09-27: 09:00:00 10 ug/kg/min via INTRAVENOUS
  Administered 2018-09-27: 01:00:00 15 ug/kg/min via INTRAVENOUS

## 2018-09-26 MED ORDER — ROCURONIUM 10 MG/ML IV SOLN
INTRAVENOUS | 0 refills | Status: DC
Start: 2018-09-26 — End: 2018-09-26
  Administered 2018-09-26: 17:00:00 40 mg via INTRAVENOUS

## 2018-09-26 MED ORDER — CEFEPIME 1G/100ML NS IVPB (MB+)
1 g | Freq: Once | INTRAVENOUS | 0 refills | Status: CP
Start: 2018-09-26 — End: ?
  Administered 2018-09-27 (×2): 1 g via INTRAVENOUS

## 2018-09-26 MED ORDER — SODIUM CHLORIDE 0.9 % IV SOLP
INTRAVENOUS | 0 refills | Status: DC
Start: 2018-09-26 — End: 2018-09-27

## 2018-09-26 MED ORDER — SODIUM BICARBONATE 1 MEQ/ML (8.4 %) IV SOLN
50 meq | Freq: Once | INTRAVENOUS | 0 refills | Status: DC
Start: 2018-09-26 — End: 2018-09-26

## 2018-09-26 MED ORDER — ETOMIDATE 2 MG/ML IV SOLN
20 mg | Freq: Once | INTRAVENOUS | 0 refills | Status: CP
Start: 2018-09-26 — End: ?
  Administered 2018-09-26: 18:00:00 20 mg via INTRAVENOUS

## 2018-09-26 MED ORDER — FUROSEMIDE 10 MG/ML IJ SOLN
80 mg | Freq: Once | INTRAVENOUS | 0 refills | Status: CP
Start: 2018-09-26 — End: ?
  Administered 2018-09-26: 13:00:00 80 mg via INTRAVENOUS

## 2018-09-26 MED ORDER — ONDANSETRON HCL (PF) 4 MG/2 ML IJ SOLN
INTRAVENOUS | 0 refills | Status: DC
Start: 2018-09-26 — End: 2018-09-26
  Administered 2018-09-26: 17:00:00 4 mg via INTRAVENOUS

## 2018-09-26 MED ORDER — CHLORHEXIDINE GLUCONATE 0.12 % MM MWSH
15 mL | Freq: Two times a day (BID) | ORAL | 0 refills | Status: DC
Start: 2018-09-26 — End: 2018-09-28
  Administered 2018-09-27 (×2): 15 mL via ORAL

## 2018-09-26 MED ORDER — SODIUM CHLORIDE 0.9 % IV SOLP
300 mL | INTRAVENOUS | 0 refills | Status: CP | PRN
Start: 2018-09-26 — End: ?
  Administered 2018-09-26: 23:00:00 300 mL via INTRAVENOUS

## 2018-09-26 MED ORDER — PROMETHAZINE 25 MG/ML IJ SOLN
6.25 mg | INTRAVENOUS | 0 refills | Status: DC | PRN
Start: 2018-09-26 — End: 2018-09-27

## 2018-09-26 MED ORDER — LACTATED RINGERS IV SOLP
1000 mL | Freq: Once | INTRAVENOUS | 0 refills | Status: DC
Start: 2018-09-26 — End: 2018-09-26

## 2018-09-26 MED ORDER — ANTICOAG SODIUM CITRATE SOLN 5ML
1-5 mL | Freq: Once | 0 refills | Status: CP
Start: 2018-09-26 — End: ?
  Administered 2018-09-27: 3.2 mL

## 2018-09-26 MED ORDER — INSULIN REGULAR HUMAN(#) 1 UNIT/ML IJ SYRINGE
10 [IU] | Freq: Once | INTRAVENOUS | 0 refills | Status: AC
Start: 2018-09-26 — End: ?

## 2018-09-26 MED ORDER — SODIUM BICARBONATE 1 MEQ/ML (8.4 %) IV SOLN
50 meq | Freq: Once | INTRAVENOUS | 0 refills | Status: CP
Start: 2018-09-26 — End: ?
  Administered 2018-09-26: 21:00:00 50 meq via INTRAVENOUS

## 2018-09-26 MED ORDER — DEXTRAN 70-HYPROMELLOSE (PF) 0.1-0.3 % OP DPET
0 refills | Status: DC
Start: 2018-09-26 — End: 2018-09-26
  Administered 2018-09-26: 17:00:00 2 [drp] via OPHTHALMIC

## 2018-09-26 MED ORDER — FENTANYL PCA/DRIP IN NS 1000MCG/100ML
10-100 ug/h | INTRAVENOUS | 0 refills | Status: DC
Start: 2018-09-26 — End: 2018-09-27
  Administered 2018-09-26: 10 ug/h via INTRAVENOUS

## 2018-09-26 MED ORDER — PANCRELIPASE-SODIUM BICARBONATE 20,880 K UNIT-650 MG
NASOGASTRIC | 0 refills | Status: DC | PRN
Start: 2018-09-26 — End: 2018-10-05

## 2018-09-26 MED ORDER — LIDOCAINE (PF) 200 MG/10 ML (2 %) IJ SYRG
0 refills | Status: DC
Start: 2018-09-26 — End: 2018-09-26
  Administered 2018-09-26: 17:00:00 100 mg via INTRAVENOUS

## 2018-09-26 MED ORDER — NEOSTIGMINE METHYLSULFATE 1 MG/ML IJ SOLN
0 refills | Status: DC
Start: 2018-09-26 — End: 2018-09-26
  Administered 2018-09-26: 17:00:00 4 mg via INTRAVENOUS

## 2018-09-26 MED ORDER — CEFEPIME IVPB
500 mg | INTRAVENOUS | 0 refills | Status: DC
Start: 2018-09-26 — End: 2018-09-26

## 2018-09-26 MED ORDER — FENTANYL CITRATE (PF) 50 MCG/ML IJ SOLN
0 refills | Status: DC
Start: 2018-09-26 — End: 2018-09-26
  Administered 2018-09-26: 16:00:00 50 ug via INTRAVENOUS

## 2018-09-26 MED ORDER — PHENYLEPHRINE IN 0.9% NACL(PF) 1 MG/10 ML (100 MCG/ML) IV SYRG
INTRAVENOUS | 0 refills | Status: DC
Start: 2018-09-26 — End: 2018-09-26
  Administered 2018-09-26: 17:00:00 100 ug via INTRAVENOUS
  Administered 2018-09-26: 17:00:00 50 ug via INTRAVENOUS
  Administered 2018-09-26 (×2): 100 ug via INTRAVENOUS

## 2018-09-26 MED ORDER — CEFEPIME IVPB
500 mg | INTRAVENOUS | 0 refills | Status: DC
Start: 2018-09-26 — End: 2018-09-27

## 2018-09-26 MED ORDER — SODIUM CHLORIDE 0.9 % IV SOLP
0 refills | Status: DC
Start: 2018-09-26 — End: 2018-09-26
  Administered 2018-09-26: 16:00:00 via INTRAVENOUS

## 2018-09-26 MED ORDER — ROCURONIUM 10 MG/ML IV SOLN
80 mg | Freq: Once | INTRAVENOUS | 0 refills | Status: CP
Start: 2018-09-26 — End: ?
  Administered 2018-09-26: 18:00:00 80 mg via INTRAVENOUS

## 2018-09-26 MED ORDER — IV PB BUILDER
Freq: Once | INTRAVENOUS | 0 refills | Status: CP
Start: 2018-09-26 — End: ?
  Administered 2018-09-26 (×2): 150.000 mL via INTRAVENOUS

## 2018-09-26 MED ORDER — DEXTROSE 50 % IN WATER (D50W) IV SOLP
25 g | Freq: Once | INTRAVENOUS | 0 refills | Status: CP
Start: 2018-09-26 — End: ?
  Administered 2018-09-26: 21:00:00 50 mL via INTRAVENOUS

## 2018-09-26 MED ORDER — PROPOFOL INJ 10 MG/ML IV VIAL
0 refills | Status: DC
Start: 2018-09-26 — End: 2018-09-26
  Administered 2018-09-26: 17:00:00 120 mg via INTRAVENOUS

## 2018-09-26 MED ORDER — CALCIUM GLUCONATE 1GM/100ML NS MB+
1 g | Freq: Once | INTRAVENOUS | 0 refills | Status: CP
Start: 2018-09-26 — End: ?
  Administered 2018-09-26 (×2): 1 g via INTRAVENOUS

## 2018-09-26 MED ORDER — EPHEDRINE SULFATE 50 MG/5ML SYR (10 MG/ML) (AN)(OSM)
0 refills | Status: DC
Start: 2018-09-26 — End: 2018-09-26
  Administered 2018-09-26 (×2): 10 mg via INTRAVENOUS

## 2018-09-26 MED ORDER — PANTOPRAZOLE 40 MG IV SOLR
40 mg | Freq: Every day | INTRAVENOUS | 0 refills | Status: DC
Start: 2018-09-26 — End: 2018-10-01
  Administered 2018-09-27 – 2018-10-01 (×4): 40 mg via INTRAVENOUS

## 2018-09-26 MED ORDER — FUROSEMIDE 10 MG/ML IJ SOLN
40 mg | Freq: Once | INTRAVENOUS | 0 refills | Status: DC
Start: 2018-09-26 — End: 2018-09-26

## 2018-09-26 MED ORDER — INSULIN REGULAR HUMAN(#) 1 UNIT/ML IJ SYRINGE
10 [IU] | Freq: Once | INTRAVENOUS | 0 refills | Status: CP
Start: 2018-09-26 — End: ?
  Administered 2018-09-26: 21:00:00 10 [IU] via INTRAVENOUS

## 2018-09-26 MED ORDER — FENTANYL CITRATE (PF) 50 MCG/ML IJ SOLN
50 ug | INTRAVENOUS | 0 refills | Status: DC | PRN
Start: 2018-09-26 — End: 2018-09-27

## 2018-09-27 ENCOUNTER — Encounter: Admit: 2018-09-27 | Discharge: 2018-09-27 | Payer: MEDICARE

## 2018-09-27 DIAGNOSIS — K219 Gastro-esophageal reflux disease without esophagitis: ICD-10-CM

## 2018-09-27 DIAGNOSIS — E119 Type 2 diabetes mellitus without complications: ICD-10-CM

## 2018-09-27 DIAGNOSIS — I509 Heart failure, unspecified: ICD-10-CM

## 2018-09-27 DIAGNOSIS — I10 Essential (primary) hypertension: Principal | ICD-10-CM

## 2018-09-27 DIAGNOSIS — G4733 Obstructive sleep apnea (adult) (pediatric): ICD-10-CM

## 2018-09-27 DIAGNOSIS — N400 Enlarged prostate without lower urinary tract symptoms: ICD-10-CM

## 2018-09-27 DIAGNOSIS — J449 Chronic obstructive pulmonary disease, unspecified: ICD-10-CM

## 2018-09-27 DIAGNOSIS — M1990 Unspecified osteoarthritis, unspecified site: ICD-10-CM

## 2018-09-27 LAB — BLOOD GASES, ARTERIAL
Lab: 126 mmHg — ABNORMAL HIGH (ref 80–100)
Lab: 20 MMOL/L — ABNORMAL LOW (ref 21–28)
Lab: 36 mmHg (ref 35–45)
Lab: 4.8 MMOL/L (ref 0–0.20)
Lab: 7.3 K/UL (ref 7.35–7.45)
Lab: 98 % (ref 95–99)
Lab: 110 mmHg — ABNORMAL HIGH (ref 80–100)
Lab: 19 MMOL/L — ABNORMAL LOW (ref 21–28)
Lab: 37 mmHg (ref 35–45)
Lab: 6.5 MMOL/L
Lab: 97 % (ref 95–99)
Lab: 7.3 — ABNORMAL LOW (ref 7.35–7.45)

## 2018-09-27 LAB — POC GLUCOSE
Lab: 135 mg/dL — ABNORMAL HIGH (ref 70–100)
Lab: 157 mg/dL — ABNORMAL HIGH (ref 70–100)
Lab: 190 mg/dL — ABNORMAL HIGH (ref 70–100)
Lab: 124 mg/dL — ABNORMAL HIGH (ref >60)

## 2018-09-27 LAB — HEPATITIS PANEL, ACUTE
Lab: NEGATIVE
Lab: NEGATIVE
Lab: NEGATIVE
Lab: NEGATIVE

## 2018-09-27 LAB — CBC AND DIFF
Lab: 1 % — ABNORMAL LOW (ref 24–44)
Lab: 13 10*3/uL — ABNORMAL HIGH (ref 4.5–11.0)
Lab: 16 % — ABNORMAL HIGH (ref >60)
Lab: 2 % — ABNORMAL LOW (ref 4–12)
Lab: 25 pg — ABNORMAL LOW (ref 26–34)
Lab: 272 K/UL — ABNORMAL LOW (ref >60)
Lab: 3.8 M/UL — ABNORMAL LOW (ref 4.4–5.5)
Lab: 31 % — ABNORMAL LOW (ref 40–50)
Lab: 31 g/dL — ABNORMAL LOW (ref 32.0–36.0)
Lab: 8.7 FL (ref 7–11)
Lab: 80 FL — ABNORMAL HIGH (ref 80–100)
Lab: 9.8 g/dL — ABNORMAL LOW (ref 13.5–16.5)
Lab: 96 % — ABNORMAL HIGH (ref 41–77)
Lab: 11 K/UL — ABNORMAL HIGH (ref >60)

## 2018-09-27 LAB — HIV 1& 2 AG-AB SCRN W REFLEX HIV 1 PCR QUANT: Lab: NEGATIVE

## 2018-09-27 LAB — C4 COMPLEMENT 4: Lab: 55 mg/dL — ABNORMAL HIGH (ref 10–49)

## 2018-09-27 LAB — BASIC METABOLIC PANEL
Lab: 102 MMOL/L (ref 98–110)
Lab: 138 MMOL/L (ref 137–147)
Lab: 143 mg/dL — ABNORMAL HIGH (ref 70–100)
Lab: 16 MMOL/L — ABNORMAL LOW (ref 21–30)
Lab: 4.9 MMOL/L (ref 3.5–5.1)
Lab: 68 mg/dL — ABNORMAL HIGH (ref 7–25)
Lab: 8.3 mg/dL — ABNORMAL LOW (ref 8.5–10.6)

## 2018-09-27 LAB — LIPASE: Lab: >30 U/L — ABNORMAL HIGH (ref 11–82)

## 2018-09-27 LAB — HEPATITIS B SURFACE AG: Lab: NEGATIVE

## 2018-09-27 LAB — MPO/PR3 W REFLEX TO ANCA

## 2018-09-27 LAB — PHOSPHORUS: Lab: 7.4 mg/dL — ABNORMAL HIGH (ref 2.0–4.5)

## 2018-09-27 LAB — COMPREHENSIVE METABOLIC PANEL: Lab: 138 MMOL/L — ABNORMAL LOW (ref >60)

## 2018-09-27 LAB — GLOMERULAR BASEMENT MEMBRANE ANTIBODIES

## 2018-09-27 LAB — C3 COMPLEMENT 3: Lab: 117 mg/dL (ref 88–200)

## 2018-09-27 MED ORDER — ANTICOAG SODIUM CITRATE SOLN 5ML
1-5 mL | Freq: Once | 0 refills | Status: CP
Start: 2018-09-27 — End: ?
  Administered 2018-09-27: 21:00:00 3.2 mL

## 2018-09-27 MED ORDER — CEFEPIME 1G/100ML NS IVPB (MB+)
1 g | INTRAVENOUS | 0 refills | Status: DC
Start: 2018-09-27 — End: 2018-09-29
  Administered 2018-09-27 – 2018-09-29 (×4): 1 g via INTRAVENOUS

## 2018-09-27 MED ORDER — SODIUM CHLORIDE 0.9 % IV SOLP
1000 mL | INTRAVENOUS | 0 refills | Status: DC | PRN
Start: 2018-09-27 — End: 2018-09-28

## 2018-09-27 MED ORDER — SODIUM CHLORIDE 0.9 % IV SOLP
300 mL | INTRAVENOUS | 0 refills | Status: CP | PRN
Start: 2018-09-27 — End: ?
  Administered 2018-09-27: 20:00:00 300 mL via INTRAVENOUS

## 2018-09-27 MED ORDER — SODIUM CHLORIDE 0.9 % IV SOLP
1000 mL | Freq: Once | INTRAVENOUS | 0 refills | Status: CP
Start: 2018-09-27 — End: ?
  Administered 2018-09-27: 23:00:00 1000 mL via INTRAVENOUS

## 2018-09-27 MED ORDER — SODIUM CHLORIDE 0.9 % IV SOLP
200 mL | INTRAVENOUS | 0 refills | Status: DC | PRN
Start: 2018-09-27 — End: 2018-09-28

## 2018-09-28 ENCOUNTER — Encounter: Admit: 2018-09-28 | Discharge: 2018-09-28 | Payer: MEDICARE

## 2018-09-28 DIAGNOSIS — G4733 Obstructive sleep apnea (adult) (pediatric): ICD-10-CM

## 2018-09-28 DIAGNOSIS — M1990 Unspecified osteoarthritis, unspecified site: ICD-10-CM

## 2018-09-28 DIAGNOSIS — J449 Chronic obstructive pulmonary disease, unspecified: ICD-10-CM

## 2018-09-28 DIAGNOSIS — K219 Gastro-esophageal reflux disease without esophagitis: ICD-10-CM

## 2018-09-28 DIAGNOSIS — I509 Heart failure, unspecified: ICD-10-CM

## 2018-09-28 DIAGNOSIS — E119 Type 2 diabetes mellitus without complications: ICD-10-CM

## 2018-09-28 DIAGNOSIS — I10 Essential (primary) hypertension: Principal | ICD-10-CM

## 2018-09-28 DIAGNOSIS — N400 Enlarged prostate without lower urinary tract symptoms: ICD-10-CM

## 2018-09-28 LAB — LIPASE: Lab: 258 U/L — ABNORMAL HIGH (ref 11–82)

## 2018-09-28 LAB — PHOSPHORUS: Lab: 5.3 mg/dL — ABNORMAL HIGH (ref 2.0–4.5)

## 2018-09-28 LAB — COMPREHENSIVE METABOLIC PANEL: Lab: 137 MMOL/L — ABNORMAL LOW (ref 137–147)

## 2018-09-28 LAB — ANTI-NUCLEAR ANTIBODY(ANA): Lab: <80 {titer} (ref <80)

## 2018-09-28 LAB — CBC AND DIFF: Lab: 9.8 10*3/uL — ABNORMAL HIGH (ref 4.5–11.0)

## 2018-09-28 LAB — POC GLUCOSE
Lab: 121 mg/dL — ABNORMAL HIGH (ref 70–100)
Lab: 106 mg/dL — ABNORMAL HIGH (ref 70–100)
Lab: 108 mg/dL — ABNORMAL HIGH (ref 70–100)

## 2018-09-28 LAB — MAGNESIUM: Lab: 2 mg/dL — ABNORMAL LOW (ref >60)

## 2018-09-28 MED ORDER — SODIUM CHLORIDE 0.9 % IV SOLP
200 mL | INTRAVENOUS | 0 refills | Status: DC | PRN
Start: 2018-09-28 — End: 2018-09-29

## 2018-09-28 MED ORDER — SODIUM CHLORIDE 0.9 % IV SOLP
1000 mL | INTRAVENOUS | 0 refills | Status: DC | PRN
Start: 2018-09-28 — End: 2018-09-29

## 2018-09-28 MED ORDER — SUCRALFATE 1 GRAM PO TAB
1 g | Freq: Three times a day (TID) | ORAL | 0 refills | Status: DC
Start: 2018-09-28 — End: 2018-09-30
  Administered 2018-09-29 – 2018-09-30 (×4): 1 g via ORAL

## 2018-09-28 MED ORDER — ANTICOAG SODIUM CITRATE SOLN 5ML
1-5 mL | Freq: Once | 0 refills | Status: CP
Start: 2018-09-28 — End: ?

## 2018-09-28 MED ORDER — ACETAMINOPHEN/LIDOCAINE/ANTACID DS(#) 1:1:3  PO SUSP
30 mL | Freq: Once | ORAL | 0 refills | Status: CP
Start: 2018-09-28 — End: ?
  Administered 2018-09-29: 02:00:00 30 mL via ORAL

## 2018-09-28 MED ORDER — SUCRALFATE 1 GRAM PO TAB
1 g | Freq: Before meals | ORAL | 0 refills | Status: DC
Start: 2018-09-28 — End: 2018-09-28
  Administered 2018-09-28: 16:00:00 1 g via ORAL

## 2018-09-28 MED ORDER — ONDANSETRON 4 MG PO TBDI
8 mg | ORAL | 0 refills | Status: DC | PRN
Start: 2018-09-28 — End: 2018-10-05

## 2018-09-28 MED ORDER — SODIUM CHLORIDE 0.9 % IV SOLP
300 mL | INTRAVENOUS | 0 refills | Status: CP | PRN
Start: 2018-09-28 — End: ?
  Administered 2018-09-28: 21:00:00 300 mL via INTRAVENOUS

## 2018-09-28 MED ADMIN — ANTICOAG SODIUM CITRATE SOLN 5ML [210585]: 3.2 mL | @ 23:00:00 | Stop: 2018-09-28 | NDC 54029018302

## 2018-09-29 ENCOUNTER — Encounter: Admit: 2018-09-29 | Discharge: 2018-09-29 | Payer: MEDICARE

## 2018-09-29 ENCOUNTER — Inpatient Hospital Stay: Admit: 2018-09-29 | Discharge: 2018-09-29 | Payer: MEDICARE

## 2018-09-29 DIAGNOSIS — R945 Abnormal results of liver function studies: ICD-10-CM

## 2018-09-29 LAB — MAGNESIUM: Lab: 1.8 mg/dL — ABNORMAL LOW (ref 1.6–2.6)

## 2018-09-29 LAB — POC GLUCOSE
Lab: 102 mg/dL — ABNORMAL HIGH (ref 70–100)
Lab: 127 mg/dL — ABNORMAL HIGH (ref 70–100)
Lab: 147 mg/dL — ABNORMAL HIGH (ref 70–100)
Lab: 148 mg/dL — ABNORMAL HIGH (ref 70–100)
Lab: 150 mg/dL — ABNORMAL HIGH (ref 70–100)

## 2018-09-29 LAB — CBC AND DIFF: Lab: 9.3 K/UL — ABNORMAL LOW (ref >60)

## 2018-09-29 LAB — COMPREHENSIVE METABOLIC PANEL: Lab: 138 MMOL/L — ABNORMAL LOW (ref 137–147)

## 2018-09-29 LAB — PHOSPHORUS: Lab: 4.3 mg/dL — ABNORMAL LOW (ref 2.0–4.5)

## 2018-09-29 LAB — LIPASE: Lab: 245 U/L — ABNORMAL HIGH (ref >60)

## 2018-09-29 MED ORDER — LIDOCAINE 5 % TP PTMD
1 | Freq: Every day | TOPICAL | 0 refills | Status: DC
Start: 2018-09-29 — End: 2018-10-05
  Administered 2018-09-30 (×2): 1 via TOPICAL

## 2018-09-29 MED ORDER — SODIUM CHLORIDE 0.9 % IV SOLP
300 mL | INTRAVENOUS | 0 refills | Status: DC | PRN
Start: 2018-09-29 — End: 2018-09-30

## 2018-09-29 MED ORDER — ACETAMINOPHEN/LIDOCAINE/ANTACID DS(#) 1:1:3  PO SUSP
30 mL | ORAL | 0 refills | Status: DC | PRN
Start: 2018-09-29 — End: 2018-09-30
  Administered 2018-09-29 – 2018-09-30 (×3): 30 mL via ORAL

## 2018-09-29 MED ORDER — SODIUM CHLORIDE 0.9 % IV SOLP
1000 mL | INTRAVENOUS | 0 refills | Status: DC | PRN
Start: 2018-09-29 — End: 2018-09-30

## 2018-09-29 MED ORDER — MAGNESIUM SULFATE IN D5W 1 GRAM/100 ML IV PGBK
1 g | Freq: Once | INTRAVENOUS | 0 refills | Status: CP
Start: 2018-09-29 — End: ?
  Administered 2018-09-29: 13:00:00 1 g via INTRAVENOUS

## 2018-09-29 MED ORDER — CEFTRIAXONE INJ 1GM IVP
1 g | INTRAVENOUS | 0 refills | Status: DC
Start: 2018-09-29 — End: 2018-10-03
  Administered 2018-09-29 – 2018-10-02 (×4): 1 g via INTRAVENOUS

## 2018-09-29 MED ORDER — ACETAMINOPHEN/LIDOCAINE/ANTACID DS(#) 1:1:3  PO SUSP
30 mL | ORAL | 0 refills | Status: DC | PRN
Start: 2018-09-29 — End: 2018-09-29

## 2018-09-29 MED ORDER — SODIUM CHLORIDE 0.9 % IV SOLP
1000 mL | INTRAVENOUS | 0 refills | Status: DC | PRN
Start: 2018-09-29 — End: 2018-09-29

## 2018-09-29 MED ORDER — ANTICOAG SODIUM CITRATE SOLN 5ML
1-5 mL | Freq: Once | 0 refills | Status: AC
Start: 2018-09-29 — End: ?

## 2018-09-29 MED ORDER — SODIUM CHLORIDE 0.9 % IV SOLP
200 mL | INTRAVENOUS | 0 refills | Status: DC | PRN
Start: 2018-09-29 — End: 2018-09-29

## 2018-09-29 MED ORDER — SODIUM CHLORIDE 0.9 % IV SOLP
200 mL | INTRAVENOUS | 0 refills | Status: DC | PRN
Start: 2018-09-29 — End: 2018-09-30

## 2018-09-29 MED ORDER — BENZOCAINE-MENTHOL 6-10 MG MM LOZG
1 | ORAL | 0 refills | Status: DC | PRN
Start: 2018-09-29 — End: 2018-10-05
  Administered 2018-09-30: 01:00:00 1 via ORAL

## 2018-09-29 MED ORDER — PATCH DOCUMENTATION - LIDOCAINE 5%
Freq: Two times a day (BID) | TRANSDERMAL | 0 refills | Status: DC
Start: 2018-09-29 — End: 2018-10-05

## 2018-09-29 MED ORDER — SODIUM CHLORIDE 0.9 % IV SOLP
300 mL | INTRAVENOUS | 0 refills | Status: DC | PRN
Start: 2018-09-29 — End: 2018-09-29

## 2018-09-30 LAB — COMPREHENSIVE METABOLIC PANEL: Lab: 139 MMOL/L — ABNORMAL LOW (ref 137–147)

## 2018-09-30 LAB — CULTURE-BLOOD W/SENSITIVITY

## 2018-09-30 LAB — POC GLUCOSE
Lab: 156 mg/dL — ABNORMAL HIGH (ref 70–100)
Lab: 128 mg/dL — ABNORMAL HIGH (ref 70–100)
Lab: 161 mg/dL — ABNORMAL HIGH (ref 70–100)
Lab: 123 mg/dL — ABNORMAL HIGH (ref 70–100)

## 2018-09-30 LAB — LIPASE: Lab: >30 U/L — ABNORMAL HIGH (ref >60)

## 2018-09-30 LAB — CBC AND DIFF
Lab: 8.8 g/dL — ABNORMAL LOW (ref >60)
Lab: 9.3 K/UL — ABNORMAL HIGH (ref >60)

## 2018-09-30 LAB — MAGNESIUM: Lab: 2 mg/dL — ABNORMAL LOW (ref 1.6–2.6)

## 2018-09-30 LAB — PHOSPHORUS: Lab: 3.7 mg/dL — ABNORMAL LOW (ref 2.0–4.5)

## 2018-09-30 MED ORDER — ACETAMINOPHEN/LIDOCAINE/ANTACID DS(#) 1:1:3  PO SUSP
30 mL | Freq: Once | ORAL | 0 refills | Status: CP
Start: 2018-09-30 — End: ?
  Administered 2018-09-30: 16:00:00 30 mL via ORAL

## 2018-09-30 MED ORDER — ACETAMINOPHEN/LIDOCAINE/ANTACID DS(#) 1:1:3  PO SUSP
30 mL | ORAL | 0 refills | Status: DC | PRN
Start: 2018-09-30 — End: 2018-10-01
  Administered 2018-09-30: 23:00:00 30 mL via ORAL

## 2018-09-30 MED ORDER — ACETAMINOPHEN 160 MG/5 ML PO SOLN
325 mg | ORAL | 0 refills | Status: DC | PRN
Start: 2018-09-30 — End: 2018-10-05
  Administered 2018-09-30: 23:00:00 325 mg via ORAL

## 2018-09-30 MED ORDER — BENZONATATE 100 MG PO CAP
100 mg | Freq: Three times a day (TID) | ORAL | 0 refills | Status: DC | PRN
Start: 2018-09-30 — End: 2018-10-05
  Administered 2018-09-30 – 2018-10-03 (×4): 100 mg via ORAL

## 2018-09-30 MED ORDER — POLYETHYLENE GLYCOL 3350 17 GRAM PO PWPK
2 | Freq: Every day | ORAL | 0 refills | Status: DC
Start: 2018-09-30 — End: 2018-10-05
  Administered 2018-10-01: 01:00:00 34 g via ORAL

## 2018-09-30 MED ORDER — BISACODYL 10 MG RE SUPP
10 mg | Freq: Once | RECTAL | 0 refills | Status: CP
Start: 2018-09-30 — End: ?
  Administered 2018-10-01: 01:00:00 10 mg via RECTAL

## 2018-09-30 MED ORDER — CARVEDILOL 6.25 MG PO TAB
6.25 mg | Freq: Two times a day (BID) | ORAL | 0 refills | Status: DC
Start: 2018-09-30 — End: 2018-10-01
  Administered 2018-09-30 – 2018-10-01 (×3): 6.25 mg via ORAL

## 2018-09-30 MED ORDER — ACETAMINOPHEN 325 MG PO TAB
650 mg | ORAL | 0 refills | Status: DC | PRN
Start: 2018-09-30 — End: 2018-09-30

## 2018-09-30 MED ORDER — ACETAMINOPHEN/LIDOCAINE/ANTACID DS(#) 1:1:3  PO SUSP
30 mL | ORAL | 0 refills | Status: DC | PRN
Start: 2018-09-30 — End: 2018-10-05
  Administered 2018-10-01 – 2018-10-05 (×16): 30 mL via ORAL

## 2018-09-30 MED ORDER — SIMETHICONE 80 MG PO CHEW
80 mg | ORAL | 0 refills | Status: DC | PRN
Start: 2018-09-30 — End: 2018-10-05

## 2018-09-30 MED ORDER — ASPIRIN 81 MG PO TBEC
81 mg | Freq: Every day | ORAL | 0 refills | Status: DC
Start: 2018-09-30 — End: 2018-10-01
  Administered 2018-09-30 – 2018-10-01 (×2): 81 mg via ORAL

## 2018-10-01 LAB — CBC AND DIFF
Lab: 10 K/UL — ABNORMAL LOW (ref >60)
Lab: 0 % (ref 0–2)
Lab: 0 10*3/uL (ref 0–0.20)
Lab: 0.2 10*3/uL (ref 0–0.45)
Lab: 0.4 10*3/uL (ref 0–0.80)
Lab: 0.7 10*3/uL — ABNORMAL LOW (ref 1.0–4.8)
Lab: 10 10*3/uL — ABNORMAL HIGH (ref 1.8–7.0)
Lab: 12 10*3/uL — ABNORMAL HIGH (ref 4.5–11.0)
Lab: 15 % — ABNORMAL HIGH (ref 11–15)
Lab: 19 % — ABNORMAL LOW (ref 40–50)
Lab: 2 % (ref 0–5)
Lab: 2.5 M/UL — ABNORMAL LOW (ref 4.4–5.5)
Lab: 25 pg — ABNORMAL LOW (ref 26–34)
Lab: 252 10*3/uL (ref 150–400)
Lab: 33 g/dL (ref 32.0–36.0)
Lab: 4 % (ref 4–12)
Lab: 6 % — ABNORMAL LOW (ref 24–44)
Lab: 6.6 g/dL — ABNORMAL LOW (ref 13.5–16.5)
Lab: 77 FL — ABNORMAL LOW (ref 80–100)
Lab: 88 % — ABNORMAL HIGH (ref 41–77)
Lab: 9.3 FL (ref 7–11)

## 2018-10-01 LAB — POC GLUCOSE
Lab: 122 mg/dL — ABNORMAL HIGH (ref 70–100)
Lab: 128 mg/dL — ABNORMAL HIGH (ref 70–100)
Lab: 179 mg/dL — ABNORMAL HIGH (ref 70–100)
Lab: 181 mg/dL — ABNORMAL HIGH (ref 70–100)

## 2018-10-01 LAB — LACTIC ACID (BG - RAPID LACTATE): Lab: 1.2 MMOL/L (ref 0.5–2.0)

## 2018-10-01 LAB — PHOSPHORUS: Lab: 3.2 mg/dL — ABNORMAL HIGH (ref >60)

## 2018-10-01 LAB — PTT (APTT): Lab: 32 s (ref 24.0–36.5)

## 2018-10-01 LAB — MAGNESIUM: Lab: 1.9 mg/dL — ABNORMAL LOW (ref 1.6–2.6)

## 2018-10-01 LAB — CULTURE-BLOOD W/SENSITIVITY: Lab: POSITIVE K/UL — ABNORMAL HIGH (ref 150–400)

## 2018-10-01 LAB — COMPREHENSIVE METABOLIC PANEL: Lab: 140 MMOL/L — ABNORMAL LOW (ref 137–147)

## 2018-10-01 LAB — POC LACTATE: Lab: 0.9 MMOL/L (ref 0.5–2.0)

## 2018-10-01 LAB — PROTIME INR (PT): Lab: 1 (ref 0.8–1.2)

## 2018-10-01 MED ORDER — PANTOPRAZOLE 40 MG IV SOLR
40 mg | Freq: Once | INTRAVENOUS | 0 refills | Status: CP
Start: 2018-10-01 — End: ?
  Administered 2018-10-01: 19:00:00 40 mg via INTRAVENOUS

## 2018-10-01 MED ORDER — PANTOPRAZOLE 40 MG IV SOLR
80 mg | Freq: Once | INTRAVENOUS | 0 refills | Status: DC
Start: 2018-10-01 — End: 2018-10-01

## 2018-10-01 MED ORDER — SODIUM CHLORIDE 0.9 % IV SOLP
INTRAVENOUS | 0 refills | Status: CN
Start: 2018-10-01 — End: ?

## 2018-10-01 MED ORDER — PANTOPRAZOLE 40 MG IV SOLR
40 mg | Freq: Every day | INTRAVENOUS | 0 refills | Status: DC
Start: 2018-10-01 — End: 2018-10-01

## 2018-10-01 MED ORDER — BISACODYL 5 MG PO TBEC
10 mg | Freq: Once | ORAL | 0 refills | Status: AC | PRN
Start: 2018-10-01 — End: ?

## 2018-10-01 MED ORDER — PEG-ELECTROLYTE SOLN 420 GRAM PO SOLR
4 L | ORAL | 0 refills | Status: DC
Start: 2018-10-01 — End: 2018-10-05
  Administered 2018-10-02: 4 L via ORAL

## 2018-10-01 MED ORDER — PEG-ELECTROLYTE SOLN 420 GRAM PO SOLR
2 L | ORAL | 0 refills | Status: DC | PRN
Start: 2018-10-01 — End: 2018-10-05
  Administered 2018-10-02: 11:00:00 2 L via ORAL

## 2018-10-01 MED ORDER — VITS A AND D-WHITE PET-LANOLIN TP OINT
TOPICAL | 0 refills | Status: DC | PRN
Start: 2018-10-01 — End: 2018-10-05
  Administered 2018-10-02: 04:00:00 via TOPICAL

## 2018-10-01 MED ORDER — PANTOPRAZOLE IV INFUSION
8 mg/h | INTRAVENOUS | 0 refills | Status: DC
Start: 2018-10-01 — End: 2018-10-02
  Administered 2018-10-01 – 2018-10-02 (×8): 8 mg/h via INTRAVENOUS

## 2018-10-02 ENCOUNTER — Encounter: Admit: 2018-10-02 | Discharge: 2018-10-02 | Payer: MEDICARE

## 2018-10-02 ENCOUNTER — Inpatient Hospital Stay: Admit: 2018-10-02 | Discharge: 2018-10-02 | Payer: MEDICARE

## 2018-10-02 DIAGNOSIS — R945 Abnormal results of liver function studies: Secondary | ICD-10-CM

## 2018-10-02 LAB — CULTURE-BLOOD W/SENSITIVITY

## 2018-10-02 LAB — CBC AND DIFF
Lab: 1 % (ref 0–2)
Lab: 10 K/UL — ABNORMAL LOW (ref 4.5–11.0)
Lab: 15 % — ABNORMAL HIGH (ref 11–15)
Lab: 198 K/UL (ref 150–400)
Lab: 2 % (ref 0–10)
Lab: 2 % (ref 0–5)
Lab: 2.8 M/UL — ABNORMAL LOW (ref 4.4–5.5)
Lab: 22 % — ABNORMAL LOW (ref 40–50)
Lab: 26 pg (ref 26–34)
Lab: 33 g/dL — ABNORMAL HIGH (ref 32.0–36.0)
Lab: 5 % (ref 4–12)
Lab: 7 % — ABNORMAL LOW (ref 24–44)
Lab: 7.5 g/dL — ABNORMAL LOW (ref 13.5–16.5)
Lab: 79 FL — ABNORMAL LOW (ref 80–100)
Lab: 8.5 10*3/uL — ABNORMAL HIGH (ref 1.8–7.0)
Lab: 83 % — ABNORMAL HIGH (ref 41–77)
Lab: 9.1 FL (ref 7–11)
Lab: 11 K/UL — ABNORMAL HIGH (ref >60)
Lab: 9.9 10*3/uL — ABNORMAL HIGH (ref 1.8–7.0)
Lab: 12 K/UL — ABNORMAL HIGH (ref 4.5–11.0)

## 2018-10-02 LAB — CBC
Lab: 12 10*3/uL — ABNORMAL HIGH (ref 4.5–11.0)
Lab: 15 % — ABNORMAL HIGH (ref 11–15)
Lab: 23 % — ABNORMAL LOW (ref 40–50)
Lab: 255 10*3/uL (ref 150–400)
Lab: 26 pg (ref 26–34)
Lab: 3 M/UL — ABNORMAL LOW (ref 4.4–5.5)
Lab: 33 g/dL (ref 32.0–36.0)
Lab: 7.8 g/dL — ABNORMAL LOW (ref 13.5–16.5)
Lab: 78 FL — ABNORMAL LOW (ref 80–100)
Lab: 9.2 FL (ref 7–11)

## 2018-10-02 LAB — PHOSPHORUS: Lab: 3.6 mg/dL — ABNORMAL HIGH (ref >60)

## 2018-10-02 LAB — POC GLUCOSE
Lab: 117 mg/dL — ABNORMAL HIGH (ref 70–100)
Lab: 124 mg/dL — ABNORMAL HIGH (ref 70–100)
Lab: 124 mg/dL — ABNORMAL HIGH (ref 70–100)
Lab: 129 mg/dL — ABNORMAL HIGH (ref 70–100)

## 2018-10-02 LAB — COMPREHENSIVE METABOLIC PANEL: Lab: 139 MMOL/L — ABNORMAL LOW (ref 137–147)

## 2018-10-02 LAB — MAGNESIUM: Lab: 1.9 mg/dL — ABNORMAL LOW (ref 1.6–2.6)

## 2018-10-02 MED ORDER — LIDOCAINE (PF) 200 MG/10 ML (2 %) IJ SYRG
0 refills | Status: DC
Start: 2018-10-02 — End: 2018-10-02
  Administered 2018-10-02: 21:00:00 60 mg via INTRAVENOUS

## 2018-10-02 MED ORDER — SODIUM CHLORIDE 0.9 % IV SOLP
0 refills | Status: DC
Start: 2018-10-02 — End: 2018-10-02
  Administered 2018-10-02: 21:00:00 via INTRAVENOUS

## 2018-10-02 MED ORDER — SODIUM CHLORIDE 0.9 % IV SOLP
200 mL | INTRAVENOUS | 0 refills | Status: AC | PRN
Start: 2018-10-02 — End: ?

## 2018-10-02 MED ORDER — PROPOFOL INJ 10 MG/ML IV VIAL
0 refills | Status: DC
Start: 2018-10-02 — End: 2018-10-02
  Administered 2018-10-02: 21:00:00 30 mg via INTRAVENOUS
  Administered 2018-10-02 (×2): 20 mg via INTRAVENOUS
  Administered 2018-10-02: 22:00:00 30 mg via INTRAVENOUS
  Administered 2018-10-02: 22:00:00 20 mg via INTRAVENOUS
  Administered 2018-10-02: 22:00:00 30 mg via INTRAVENOUS

## 2018-10-02 MED ORDER — ANTICOAG SODIUM CITRATE SOLN 5ML
1-5 mL | Freq: Once | 0 refills | Status: AC
Start: 2018-10-02 — End: ?

## 2018-10-02 MED ORDER — SODIUM CHLORIDE 0.9 % IV SOLP
INTRAVENOUS | 0 refills | Status: CN
Start: 2018-10-02 — End: ?

## 2018-10-02 MED ORDER — PROPOFOL 10 MG/ML IV EMUL 20 ML (INFUSION)(AM)(OR)
INTRAVENOUS | 0 refills | Status: DC
Start: 2018-10-02 — End: 2018-10-02
  Administered 2018-10-02: 21:00:00 120 ug/kg/min via INTRAVENOUS

## 2018-10-02 MED ORDER — PANTOPRAZOLE 40 MG PO TBEC
40 mg | Freq: Every day | ORAL | 0 refills | Status: DC
Start: 2018-10-02 — End: 2018-10-05
  Administered 2018-10-03 – 2018-10-05 (×3): 40 mg via ORAL

## 2018-10-02 MED ORDER — SODIUM CHLORIDE 0.9 % IV SOLP
1000 mL | INTRAVENOUS | 0 refills | Status: AC | PRN
Start: 2018-10-02 — End: ?

## 2018-10-02 MED ORDER — POTASSIUM CHLORIDE IN WATER 10 MEQ/50 ML IV PGBK
10 meq | INTRAVENOUS | 0 refills | Status: AC
Start: 2018-10-02 — End: ?

## 2018-10-02 MED ORDER — POTASSIUM CHLORIDE 20 MEQ PO TBTQ
20 meq | Freq: Once | ORAL | 0 refills | Status: CP
Start: 2018-10-02 — End: ?
  Administered 2018-10-02: 18:00:00 20 meq via ORAL

## 2018-10-02 MED ORDER — SODIUM CHLORIDE 0.9 % IV SOLP
300 mL | INTRAVENOUS | 0 refills | Status: AC | PRN
Start: 2018-10-02 — End: ?

## 2018-10-03 ENCOUNTER — Encounter: Admit: 2018-10-03 | Discharge: 2018-10-03 | Payer: MEDICARE

## 2018-10-03 DIAGNOSIS — R945 Abnormal results of liver function studies: Secondary | ICD-10-CM

## 2018-10-03 DIAGNOSIS — Z95810 Presence of automatic (implantable) cardiac defibrillator: Principal | ICD-10-CM

## 2018-10-03 LAB — CBC
Lab: 12 10*3/uL — ABNORMAL HIGH (ref 4.5–11.0)
Lab: 11 10*3/uL — ABNORMAL HIGH (ref 4.5–11.0)
Lab: 16 % — ABNORMAL HIGH (ref 11–15)
Lab: 2.8 M/UL — ABNORMAL LOW (ref 4.4–5.5)
Lab: 22 % — ABNORMAL LOW (ref 40–50)
Lab: 246 10*3/uL (ref 150–400)
Lab: 26 pg (ref 26–34)
Lab: 33 g/dL (ref 32.0–36.0)
Lab: 7.6 g/dL — ABNORMAL LOW (ref 13.5–16.5)
Lab: 78 FL — ABNORMAL LOW (ref 80–100)
Lab: 8.8 FL (ref 7–11)
Lab: 2.9 M/UL — ABNORMAL LOW (ref 4.4–5.5)
Lab: 23 % — ABNORMAL LOW (ref 40–50)
Lab: 33 g/dL (ref 32.0–36.0)
Lab: 7.7 g/dL — ABNORMAL LOW (ref 13.5–16.5)
Lab: 79 FL — ABNORMAL LOW (ref 80–100)
Lab: 9.9 10*3/uL (ref 4.5–11.0)

## 2018-10-03 LAB — MAGNESIUM: Lab: 1.7 mg/dL — ABNORMAL LOW (ref 1.6–2.6)

## 2018-10-03 LAB — PHOSPHORUS: Lab: 3.8 mg/dL — ABNORMAL LOW (ref 2.0–4.5)

## 2018-10-03 LAB — POC GLUCOSE
Lab: 155 mg/dL — ABNORMAL HIGH (ref 70–100)
Lab: 135 mg/dL — ABNORMAL HIGH (ref 70–100)
Lab: 198 mg/dL — ABNORMAL HIGH (ref 70–100)
Lab: 106 mg/dL — ABNORMAL HIGH (ref 70–100)

## 2018-10-03 LAB — COMPREHENSIVE METABOLIC PANEL: Lab: 139 MMOL/L — ABNORMAL LOW (ref 137–147)

## 2018-10-03 MED ORDER — GADOBENATE DIMEGLUMINE 529 MG/ML (0.1MMOL/0.2ML) IV SOLN
15 mL | Freq: Once | INTRAVENOUS | 0 refills | Status: CP
Start: 2018-10-03 — End: ?
  Administered 2018-10-03: 17:00:00 15 mL via INTRAVENOUS

## 2018-10-03 MED ORDER — POTASSIUM CHLORIDE 20 MEQ/15 ML PO LIQD
20 meq | Freq: Once | ORAL | 0 refills | Status: CP
Start: 2018-10-03 — End: ?
  Administered 2018-10-03: 11:00:00 20 meq via ORAL

## 2018-10-03 MED ORDER — POTASSIUM CHLORIDE 20 MEQ/15 ML PO LIQD
40 meq | Freq: Once | ORAL | 0 refills | Status: DC
Start: 2018-10-03 — End: 2018-10-03

## 2018-10-03 MED ORDER — LEVOFLOXACIN 500 MG PO TAB
500 mg | ORAL | 0 refills | Status: DC
Start: 2018-10-03 — End: 2018-10-05
  Administered 2018-10-03 – 2018-10-05 (×2): 500 mg via ORAL

## 2018-10-03 MED ORDER — SODIUM CHLORIDE 0.9 % IJ SOLN
50 mL | Freq: Once | INTRAVENOUS | 0 refills | Status: CP
Start: 2018-10-03 — End: ?
  Administered 2018-10-03: 17:00:00 50 mL via INTRAVENOUS

## 2018-10-04 ENCOUNTER — Encounter: Admit: 2018-10-04 | Discharge: 2018-10-04 | Payer: MEDICARE

## 2018-10-04 DIAGNOSIS — I509 Heart failure, unspecified: ICD-10-CM

## 2018-10-04 DIAGNOSIS — E119 Type 2 diabetes mellitus without complications: ICD-10-CM

## 2018-10-04 DIAGNOSIS — I10 Essential (primary) hypertension: Principal | ICD-10-CM

## 2018-10-04 DIAGNOSIS — N400 Enlarged prostate without lower urinary tract symptoms: ICD-10-CM

## 2018-10-04 DIAGNOSIS — G4733 Obstructive sleep apnea (adult) (pediatric): ICD-10-CM

## 2018-10-04 DIAGNOSIS — M1990 Unspecified osteoarthritis, unspecified site: ICD-10-CM

## 2018-10-04 DIAGNOSIS — K219 Gastro-esophageal reflux disease without esophagitis: ICD-10-CM

## 2018-10-04 DIAGNOSIS — J449 Chronic obstructive pulmonary disease, unspecified: ICD-10-CM

## 2018-10-04 LAB — CBC
Lab: 13 10*3/uL — ABNORMAL HIGH (ref 4.5–11.0)
Lab: 16 % — ABNORMAL HIGH (ref 11–15)
Lab: 2.9 M/UL — ABNORMAL LOW (ref 4.4–5.5)
Lab: 23 % — ABNORMAL LOW (ref 40–50)
Lab: 25 pg — ABNORMAL LOW (ref 26–34)
Lab: 265 10*3/uL (ref 150–400)
Lab: 32 g/dL (ref 32.0–36.0)
Lab: 7.6 g/dL — ABNORMAL LOW (ref 13.5–16.5)
Lab: 79 FL — ABNORMAL LOW (ref 80–100)
Lab: 8.6 FL (ref 7–11)
Lab: 9.4 10*3/uL — ABNORMAL LOW (ref >60)

## 2018-10-04 LAB — POC GLUCOSE
Lab: 141 mg/dL — ABNORMAL HIGH (ref 70–100)
Lab: 129 mg/dL — ABNORMAL HIGH (ref 70–100)
Lab: 178 mg/dL — ABNORMAL HIGH (ref 70–100)

## 2018-10-04 LAB — COMPREHENSIVE METABOLIC PANEL: Lab: 142 MMOL/L — ABNORMAL HIGH (ref 137–147)

## 2018-10-04 LAB — PHOSPHORUS: Lab: 2.9 mg/dL — ABNORMAL LOW (ref >60)

## 2018-10-04 LAB — MAGNESIUM: Lab: 1.6 mg/dL — ABNORMAL LOW (ref 1.6–2.6)

## 2018-10-04 MED ORDER — PROMETHAZINE 25 MG/ML IJ SOLN
6.25 mg | INTRAVENOUS | 0 refills | Status: DC | PRN
Start: 2018-10-04 — End: 2018-10-04

## 2018-10-04 MED ORDER — FENTANYL CITRATE (PF) 50 MCG/ML IJ SOLN
50 ug | INTRAVENOUS | 0 refills | Status: DC | PRN
Start: 2018-10-04 — End: 2018-10-04

## 2018-10-04 MED ORDER — IMS MIXTURE TEMPLATE
30 meq | Freq: Once | ORAL | 0 refills | Status: CP
Start: 2018-10-04 — End: ?
  Administered 2018-10-04 (×2): 30 meq via ORAL

## 2018-10-04 MED ORDER — SODIUM CHLORIDE 0.9 % IV SOLP
500 mL | INTRAVENOUS | 0 refills | Status: CP
Start: 2018-10-04 — End: ?
  Administered 2018-10-04: 21:00:00 500 mL via INTRAVENOUS

## 2018-10-04 MED ORDER — DIPHENHYDRAMINE HCL 50 MG/ML IJ SOLN
25 mg | Freq: Once | INTRAVENOUS | 0 refills | Status: DC | PRN
Start: 2018-10-04 — End: 2018-10-04

## 2018-10-04 MED ORDER — METOCLOPRAMIDE HCL 5 MG/ML IJ SOLN
10 mg | Freq: Once | INTRAVENOUS | 0 refills | Status: DC | PRN
Start: 2018-10-04 — End: 2018-10-04

## 2018-10-04 MED ORDER — FENTANYL CITRATE (PF) 50 MCG/ML IJ SOLN
25 ug | INTRAVENOUS | 0 refills | Status: DC | PRN
Start: 2018-10-04 — End: 2018-10-04

## 2018-10-05 ENCOUNTER — Inpatient Hospital Stay: Admit: 2018-09-26 | Discharge: 2018-09-26 | Payer: MEDICARE

## 2018-10-05 ENCOUNTER — Inpatient Hospital Stay: Admit: 2018-10-02 | Discharge: 2018-10-02 | Payer: MEDICARE

## 2018-10-05 ENCOUNTER — Inpatient Hospital Stay
Admit: 2018-09-25 | Discharge: 2018-10-05 | Disposition: A | Discharge status: Home Health | Payer: MEDICARE | Source: Other Acute Inpatient Hospital | Admitting: Critical Care Medicine

## 2018-10-05 ENCOUNTER — Encounter: Admit: 2018-10-05 | Discharge: 2018-10-05 | Payer: MEDICARE

## 2018-10-05 ENCOUNTER — Inpatient Hospital Stay: Admit: 2018-10-03 | Discharge: 2018-10-04 | Payer: MEDICARE

## 2018-10-05 DIAGNOSIS — C250 Malignant neoplasm of head of pancreas: ICD-10-CM

## 2018-10-05 DIAGNOSIS — I959 Hypotension, unspecified: ICD-10-CM

## 2018-10-05 DIAGNOSIS — Z21 Asymptomatic human immunodeficiency virus [HIV] infection status: ICD-10-CM

## 2018-10-05 DIAGNOSIS — K819 Cholecystitis, unspecified: Principal | ICD-10-CM

## 2018-10-05 DIAGNOSIS — I5042 Chronic combined systolic (congestive) and diastolic (congestive) heart failure: ICD-10-CM

## 2018-10-05 DIAGNOSIS — K5730 Diverticulosis of large intestine without perforation or abscess without bleeding: ICD-10-CM

## 2018-10-05 DIAGNOSIS — I130 Hypertensive heart and chronic kidney disease with heart failure and stage 1 through stage 4 chronic kidney disease, or unspecified chronic kidney disease: ICD-10-CM

## 2018-10-05 DIAGNOSIS — Z95810 Presence of automatic (implantable) cardiac defibrillator: ICD-10-CM

## 2018-10-05 DIAGNOSIS — Z7984 Long term (current) use of oral hypoglycemic drugs: ICD-10-CM

## 2018-10-05 DIAGNOSIS — K859 Acute pancreatitis without necrosis or infection, unspecified: ICD-10-CM

## 2018-10-05 DIAGNOSIS — Z87891 Personal history of nicotine dependence: ICD-10-CM

## 2018-10-05 DIAGNOSIS — E872 Acidosis: ICD-10-CM

## 2018-10-05 DIAGNOSIS — E785 Hyperlipidemia, unspecified: ICD-10-CM

## 2018-10-05 DIAGNOSIS — K264 Chronic or unspecified duodenal ulcer with hemorrhage: ICD-10-CM

## 2018-10-05 DIAGNOSIS — G4733 Obstructive sleep apnea (adult) (pediatric): ICD-10-CM

## 2018-10-05 DIAGNOSIS — J449 Chronic obstructive pulmonary disease, unspecified: ICD-10-CM

## 2018-10-05 DIAGNOSIS — K640 First degree hemorrhoids: ICD-10-CM

## 2018-10-05 DIAGNOSIS — G92 Toxic encephalopathy: ICD-10-CM

## 2018-10-05 DIAGNOSIS — B961 Klebsiella pneumoniae [K. pneumoniae] as the cause of diseases classified elsewhere: ICD-10-CM

## 2018-10-05 DIAGNOSIS — K635 Polyp of colon: ICD-10-CM

## 2018-10-05 DIAGNOSIS — R7881 Bacteremia: ICD-10-CM

## 2018-10-05 DIAGNOSIS — N183 Chronic kidney disease, stage 3 (moderate): ICD-10-CM

## 2018-10-05 DIAGNOSIS — M545 Low back pain: ICD-10-CM

## 2018-10-05 DIAGNOSIS — N179 Acute kidney failure, unspecified: ICD-10-CM

## 2018-10-05 DIAGNOSIS — J9601 Acute respiratory failure with hypoxia: ICD-10-CM

## 2018-10-05 DIAGNOSIS — E1122 Type 2 diabetes mellitus with diabetic chronic kidney disease: ICD-10-CM

## 2018-10-05 DIAGNOSIS — D62 Acute posthemorrhagic anemia: ICD-10-CM

## 2018-10-05 DIAGNOSIS — K831 Obstruction of bile duct: ICD-10-CM

## 2018-10-05 DIAGNOSIS — N400 Enlarged prostate without lower urinary tract symptoms: ICD-10-CM

## 2018-10-05 DIAGNOSIS — K219 Gastro-esophageal reflux disease without esophagitis: ICD-10-CM

## 2018-10-05 DIAGNOSIS — E875 Hyperkalemia: ICD-10-CM

## 2018-10-05 DIAGNOSIS — G8929 Other chronic pain: ICD-10-CM

## 2018-10-05 LAB — BASIC METABOLIC PANEL
Lab: 102 MMOL/L (ref 98–110)
Lab: 138 MMOL/L (ref 137–147)
Lab: 17 mg/dL (ref 7–25)
Lab: 204 mg/dL — ABNORMAL HIGH (ref 70–100)
Lab: 21 mL/min — ABNORMAL LOW (ref >60)
Lab: 24 MMOL/L (ref 21–30)
Lab: 25 mL/min — ABNORMAL LOW (ref >60)
Lab: 3 mg/dL — ABNORMAL HIGH (ref 0.4–1.24)
Lab: 3.6 MMOL/L (ref 3.5–5.1)
Lab: 7.9 mg/dL — ABNORMAL LOW (ref 8.5–10.6)
Lab: 12 (ref 3–12)

## 2018-10-05 LAB — POC GLUCOSE
Lab: 115 mg/dL — ABNORMAL HIGH (ref 70–100)
Lab: 146 mg/dL — ABNORMAL HIGH (ref >60)
Lab: 127 mg/dL — ABNORMAL HIGH (ref 70–100)
Lab: 145 mg/dL — ABNORMAL HIGH (ref 70–100)

## 2018-10-05 LAB — PHOSPHORUS: Lab: 2.7 mg/dL — ABNORMAL LOW (ref >60)

## 2018-10-05 LAB — CBC: Lab: 7.7 K/UL — ABNORMAL LOW (ref >60)

## 2018-10-05 MED ORDER — ACETAMINOPHEN/LIDOCAINE/ANTACID DS(#) 1:1:3  PO SUSP
30 mL | ORAL | 3 refills | Status: SS | PRN
Start: 2018-10-05 — End: 2019-06-19

## 2018-10-05 MED ORDER — PANTOPRAZOLE 40 MG PO TBEC
40 mg | ORAL_TABLET | Freq: Every day | ORAL | 0 refills | 90.00000 days | Status: AC
Start: 2018-10-05 — End: 2019-04-28

## 2018-10-05 MED ORDER — LEVOFLOXACIN 500 MG PO TAB
500 mg | ORAL_TABLET | ORAL | 0 refills | Status: SS
Start: 2018-10-05 — End: 2019-06-11

## 2018-10-09 ENCOUNTER — Encounter: Admit: 2018-10-09 | Discharge: 2018-10-09 | Payer: MEDICARE

## 2018-10-12 ENCOUNTER — Ambulatory Visit: Admit: 2018-10-12 | Discharge: 2018-10-13 | Payer: MEDICARE

## 2018-10-12 DIAGNOSIS — Z95810 Presence of automatic (implantable) cardiac defibrillator: Principal | ICD-10-CM

## 2018-10-19 ENCOUNTER — Encounter: Admit: 2018-10-19 | Discharge: 2018-10-19 | Payer: MEDICARE

## 2018-10-19 DIAGNOSIS — C259 Malignant neoplasm of pancreas, unspecified: Secondary | ICD-10-CM

## 2018-10-23 ENCOUNTER — Encounter: Admit: 2018-10-23 | Discharge: 2018-10-23 | Payer: MEDICARE

## 2018-10-24 ENCOUNTER — Ambulatory Visit: Admit: 2018-10-24 | Discharge: 2018-10-24 | Payer: MEDICARE

## 2018-10-24 ENCOUNTER — Encounter: Admit: 2018-10-24 | Discharge: 2018-10-24 | Payer: MEDICARE

## 2018-10-24 DIAGNOSIS — E118 Type 2 diabetes mellitus with unspecified complications: Secondary | ICD-10-CM

## 2018-10-24 DIAGNOSIS — N179 Acute kidney failure, unspecified: Secondary | ICD-10-CM

## 2018-10-24 DIAGNOSIS — C259 Malignant neoplasm of pancreas, unspecified: Secondary | ICD-10-CM

## 2018-10-24 DIAGNOSIS — J449 Chronic obstructive pulmonary disease, unspecified: Secondary | ICD-10-CM

## 2018-10-24 DIAGNOSIS — G4733 Obstructive sleep apnea (adult) (pediatric): Secondary | ICD-10-CM

## 2018-10-24 DIAGNOSIS — N400 Enlarged prostate without lower urinary tract symptoms: Secondary | ICD-10-CM

## 2018-10-24 DIAGNOSIS — I10 Essential (primary) hypertension: Secondary | ICD-10-CM

## 2018-10-24 DIAGNOSIS — I509 Heart failure, unspecified: Secondary | ICD-10-CM

## 2018-10-24 DIAGNOSIS — K219 Gastro-esophageal reflux disease without esophagitis: Secondary | ICD-10-CM

## 2018-10-24 DIAGNOSIS — M549 Dorsalgia, unspecified: Secondary | ICD-10-CM

## 2018-10-24 DIAGNOSIS — N183 Chronic kidney disease, stage 3 (moderate): Secondary | ICD-10-CM

## 2018-10-24 DIAGNOSIS — C250 Malignant neoplasm of head of pancreas: Secondary | ICD-10-CM

## 2018-10-24 DIAGNOSIS — E119 Type 2 diabetes mellitus without complications: Secondary | ICD-10-CM

## 2018-10-24 DIAGNOSIS — M1990 Unspecified osteoarthritis, unspecified site: Secondary | ICD-10-CM

## 2018-10-24 LAB — CBC AND DIFF
Lab: 0 10*3/uL (ref 0–0.20)
Lab: 0.2 10*3/uL (ref 0–0.45)
Lab: 0.5 10*3/uL (ref 0–0.80)
Lab: 1 % — ABNORMAL LOW (ref >60)
Lab: 16 % — ABNORMAL HIGH (ref >60)
Lab: 2.2 10*3/uL (ref 1.0–4.8)
Lab: 26 pg (ref 26–34)
Lab: 272 K/UL — ABNORMAL HIGH (ref 150–400)
Lab: 28 % — ABNORMAL LOW (ref 40–50)
Lab: 3 % — ABNORMAL LOW (ref >60)
Lab: 3.4 10*3/uL (ref 1.8–7.0)
Lab: 3.5 M/UL — ABNORMAL LOW (ref 4.4–5.5)
Lab: 32 g/dL — ABNORMAL HIGH (ref >60)
Lab: 35 % (ref 24–44)
Lab: 53 % (ref 41–77)
Lab: 6.3 K/UL (ref 4.5–11.0)
Lab: 7.9 FL (ref 7–11)
Lab: 8 % (ref 4–12)
Lab: 81 FL (ref 80–100)
Lab: 9.2 g/dL — ABNORMAL LOW (ref 13.5–16.5)

## 2018-10-24 LAB — CA19.9: Lab: 376 U/mL — ABNORMAL HIGH (ref <35)

## 2018-10-24 LAB — COMPREHENSIVE METABOLIC PANEL
Lab: 140 MMOL/L (ref 137–147)
Lab: 3.3 MMOL/L — ABNORMAL LOW (ref 3.5–5.1)

## 2018-10-25 ENCOUNTER — Encounter: Admit: 2018-10-25 | Discharge: 2018-10-25 | Payer: MEDICARE

## 2018-10-26 ENCOUNTER — Encounter: Admit: 2018-10-26 | Discharge: 2018-10-26 | Payer: MEDICARE

## 2018-11-07 ENCOUNTER — Encounter: Admit: 2018-11-07 | Discharge: 2018-11-07 | Payer: MEDICARE

## 2018-11-11 ENCOUNTER — Encounter: Admit: 2018-11-11 | Discharge: 2018-11-11 | Payer: MEDICARE

## 2018-11-13 ENCOUNTER — Encounter: Admit: 2018-11-13 | Discharge: 2018-11-13 | Payer: MEDICARE

## 2018-11-22 ENCOUNTER — Encounter: Admit: 2018-11-22 | Discharge: 2018-11-22 | Payer: MEDICARE

## 2018-12-05 ENCOUNTER — Ambulatory Visit: Admit: 2018-12-05 | Discharge: 2018-12-05 | Payer: MEDICARE

## 2018-12-05 DIAGNOSIS — Z95810 Presence of automatic (implantable) cardiac defibrillator: ICD-10-CM

## 2018-12-05 DIAGNOSIS — I472 Ventricular tachycardia: ICD-10-CM

## 2018-12-05 DIAGNOSIS — I5042 Chronic combined systolic (congestive) and diastolic (congestive) heart failure: Principal | ICD-10-CM

## 2018-12-25 NOTE — Progress Notes
??? Heart Disease Sister    ??? Hypertension Sister    ??? Kidney Disease Sister    ??? Alcohol abuse Brother    ??? Diabetes Brother    ??? Heart Disease Brother    ??? Hypertension Brother    ??? Stroke Brother      Social History     Socioeconomic History   ??? Marital status: Widowed     Spouse name: Not on file   ??? Number of children: Not on file   ??? Years of education: Not on file   ??? Highest education level: Not on file   Occupational History   ??? Occupation: retired   Tobacco Use   ??? Smoking status: Former Smoker     Packs/day: 1.00     Years: 50.00     Pack years: 50.00     Types: Cigarettes     Last attempt to quit: 10/22/2015     Years since quitting: 3.1   ??? Smokeless tobacco: Never Used   Substance and Sexual Activity   ??? Alcohol use: No     Alcohol/week: 0.0 standard drinks   ??? Drug use: No   ??? Sexual activity: Yes     Partners: Female   Other Topics Concern   ??? Not on file   Social History Narrative   ??? Not on file       Allergies   Allergen Reactions   ??? Strawberry RASH and SHORTNESS OF BREATH   ??? Lisinopril CHILLS     no cough with losartan   ??? Penicillins RASH and SHORTNESS OF BREATH       Objective:         ??? acetaminophen/lidocaine/antacid DS(#) (GI COCKTAIL) 1:1:3 Take 30 mL by mouth every 3 hours as needed.   ??? allopurinol (ZYLOPRIM) 300 mg tablet Take 1 tablet by mouth daily.   ??? aspirin EC 81 mg tablet Take 81 mg by mouth daily. Take with food.   ??? atorvastatin (LIPITOR) 40 mg tablet TAKE 1 TABLET BY MOUTH ONCE DAILY AT BEDTIME   ??? carvedilol (COREG) 25 mg tablet TAKE 1 TABLET BY MOUTH TWICE DAILY WITH MEALS **TAKE  WITH  FOOD**   ??? empagliflozin 25 mg tab Take 1 tablet by mouth daily.   ??? levoFLOXacin (LEVAQUIN) 500 mg tablet Take one tablet by mouth every 48 hours. Indications: Severe Allergy to Beta-Lactam Antibiotic   ??? metFORMIN (GLUCOPHAGE) 1,000 mg tablet Take 1,000 mg by mouth twice daily with meals.   ??? pantoprazole DR (PROTONIX) 40 mg tablet Take one tablet by mouth daily. We did talk about slight rise in his liver enzymes and bilirubin this could be a sign that is stent is starting to malfunction.  He will likely require a stent replacement and I recommended he see our gastroenterologist to discuss stent replacement as that will prevent biliary obstruction that led to his sepsis in the past.  The patient was agreeable to proceed and a referral to gastroenterology was sent.  I answered his and his daughter's questions to their content.  The patient continues to decline hospice    Acute kidney injury secondary to sepsis currently not on dialysis creatinine is stable    Coronary artery disease status post AICD with a history of congestive heart failure.  Following up with cardiology.  I recommended follow-up with his cardiologist soon for his pericardial effusion and increased interstitial edema this could be a sign that his congestive heart failure medications need to be modified  Type 2 diabetes following up with his primary care physician.      Follow-up as needed.

## 2018-12-26 ENCOUNTER — Encounter: Admit: 2018-12-26 | Discharge: 2018-12-26 | Payer: MEDICARE

## 2018-12-26 DIAGNOSIS — C259 Malignant neoplasm of pancreas, unspecified: Principal | ICD-10-CM

## 2018-12-26 DIAGNOSIS — C250 Malignant neoplasm of head of pancreas: ICD-10-CM

## 2018-12-27 ENCOUNTER — Encounter: Admit: 2018-12-27 | Discharge: 2018-12-27 | Payer: MEDICARE

## 2018-12-27 ENCOUNTER — Ambulatory Visit: Admit: 2018-12-27 | Discharge: 2018-12-28 | Payer: MEDICARE

## 2018-12-27 DIAGNOSIS — C250 Malignant neoplasm of head of pancreas: Principal | ICD-10-CM

## 2018-12-27 DIAGNOSIS — N183 Chronic kidney disease, stage 3 (moderate): ICD-10-CM

## 2018-12-27 DIAGNOSIS — C259 Malignant neoplasm of pancreas, unspecified: ICD-10-CM

## 2018-12-27 LAB — CBC AND DIFF
Lab: 0 10*3/uL (ref 0–0.20)
Lab: 0.2 10*3/uL (ref 0–0.45)
Lab: 0.4 10*3/uL (ref 0–0.80)
Lab: 1 % — ABNORMAL LOW (ref >60)
Lab: 1.9 10*3/uL (ref 1.0–4.8)
Lab: 17 % — ABNORMAL HIGH (ref 11–15)
Lab: 3.5 10*3/uL — ABNORMAL LOW (ref >60)
Lab: 30 % — ABNORMAL LOW (ref 40–50)
Lab: 30 g/dL — ABNORMAL LOW (ref 32.0–36.0)
Lab: 370 10*3/uL (ref 150–400)
Lab: 58 % — ABNORMAL HIGH (ref 41–77)
Lab: 80 FL — ABNORMAL HIGH (ref 80–100)
Lab: 9.4 g/dL — ABNORMAL LOW (ref 13.5–16.5)

## 2018-12-27 LAB — POC CREATININE, RAD: Lab: 1.7 mg/dL — ABNORMAL HIGH (ref 0.4–1.24)

## 2018-12-27 LAB — COMPREHENSIVE METABOLIC PANEL: Lab: 138 MMOL/L (ref 137–147)

## 2018-12-27 LAB — CA19.9: Lab: 774 U/mL — ABNORMAL HIGH (ref <35)

## 2018-12-27 MED ORDER — SODIUM CHLORIDE 0.9 % IJ SOLN
50 mL | Freq: Once | INTRAVENOUS | 0 refills | Status: CP
Start: 2018-12-27 — End: ?
  Administered 2018-12-27: 16:00:00 50 mL via INTRAVENOUS

## 2018-12-27 MED ORDER — IOHEXOL 350 MG IODINE/ML IV SOLN
100 mL | Freq: Once | INTRAVENOUS | 0 refills | Status: CP
Start: 2018-12-27 — End: ?
  Administered 2018-12-27: 16:00:00 100 mL via INTRAVENOUS

## 2018-12-27 NOTE — Patient Instructions
Dr.Raed Al-Rajabi MD Medical Oncologist specializing in Gastro-Intestinal malignancies   Molly Silver APRN  Phone: [phone] Monday-Friday 8:00am- 4:00pm Nurses: Lauren Barker BSN RN and Hallis Meditz BSN RN   After Hours Phone: [phone]- On call number for urgent needs outside of business hours ask for the On call Oncology fellow to be paged and they will call you back  Medication refills: please contact your pharmacy for medication refills, if they do not have any refills on file they will contact the office, make sure they have our correct contact information on file P: [phone], F: [phone]  MyChart Messages: All mychart messages are answered by the nurses, even if you select to send the message to Dr. Al-Rajabi or Molly. We often communicate with Dr. Al-Rajabi or Molly regarding how to answer these messages. Messages are answered 8:00am-4:00pm Monday-Friday.  Phone Calls: We are typically not at our desks where our phones are located as we are in clinic. Please leave us a message as we check our messages several times per day. It is our goal to answer these messages as soon as possible. Messages are answered from 8:00am-4:00 pm Monday-Friday. Please make sure you leave your full name with the spelling, date of birth, and reason for your call when leaving a message.

## 2019-01-01 ENCOUNTER — Encounter: Admit: 2019-01-01 | Discharge: 2019-01-01 | Payer: MEDICARE

## 2019-01-02 ENCOUNTER — Encounter: Admit: 2019-01-02 | Discharge: 2019-01-02 | Payer: MEDICARE

## 2019-01-02 DIAGNOSIS — C250 Malignant neoplasm of head of pancreas: Principal | ICD-10-CM

## 2019-01-03 ENCOUNTER — Encounter: Admit: 2019-01-03 | Discharge: 2019-01-03 | Payer: MEDICARE

## 2019-01-03 DIAGNOSIS — C250 Malignant neoplasm of head of pancreas: Principal | ICD-10-CM

## 2019-01-04 ENCOUNTER — Encounter: Admit: 2019-01-04 | Discharge: 2019-01-04 | Payer: MEDICARE

## 2019-01-05 NOTE — Telephone Encounter
Spoke to pt and relayed recommendations; pt verbalized understanding and is agreeable to lab work in one week. Per pt request faxed lab orders to [facility] (fax# [phone]).     Remider set to f/u on results.

## 2019-01-18 ENCOUNTER — Encounter: Admit: 2019-01-18 | Discharge: 2019-01-18 | Payer: MEDICARE

## 2019-01-18 DIAGNOSIS — C250 Malignant neoplasm of head of pancreas: Principal | ICD-10-CM

## 2019-01-18 LAB — COMPREHENSIVE METABOLIC PANEL
Lab: 19
Lab: 19 — ABNORMAL LOW
Lab: 3.7
Lab: 107
Lab: 3.6

## 2019-03-08 ENCOUNTER — Encounter: Admit: 2019-03-08 | Discharge: 2019-03-08 | Payer: MEDICARE

## 2019-03-08 ENCOUNTER — Ambulatory Visit: Admit: 2019-03-08 | Discharge: 2019-03-09 | Payer: MEDICARE

## 2019-03-08 DIAGNOSIS — Z95810 Presence of automatic (implantable) cardiac defibrillator: Secondary | ICD-10-CM

## 2019-03-09 DIAGNOSIS — I5042 Chronic combined systolic (congestive) and diastolic (congestive) heart failure: Principal | ICD-10-CM

## 2019-03-09 DIAGNOSIS — I472 Ventricular tachycardia: ICD-10-CM

## 2019-03-26 ENCOUNTER — Encounter: Admit: 2019-03-26 | Discharge: 2019-03-26

## 2019-03-26 MED ORDER — CARVEDILOL 25 MG PO TAB
ORAL_TABLET | Freq: Two times a day (BID) | ORAL | 3 refills | 90.00000 days | Status: DC
Start: 2019-03-26 — End: 2019-06-19

## 2019-04-10 ENCOUNTER — Encounter: Admit: 2019-04-10 | Discharge: 2019-04-10

## 2019-04-10 ENCOUNTER — Ambulatory Visit: Admit: 2019-04-10 | Discharge: 2019-04-11

## 2019-04-10 DIAGNOSIS — Z95810 Presence of automatic (implantable) cardiac defibrillator: Secondary | ICD-10-CM

## 2019-04-28 MED ORDER — PANTOPRAZOLE 40 MG PO TBEC
ORAL_TABLET | Freq: Every day | 0 refills | Status: SS
Start: 2019-04-28 — End: ?

## 2019-05-01 ENCOUNTER — Encounter: Admit: 2019-05-01 | Discharge: 2019-05-01

## 2019-05-01 ENCOUNTER — Ambulatory Visit: Admit: 2019-05-01 | Discharge: 2019-05-02

## 2019-05-01 DIAGNOSIS — G4733 Obstructive sleep apnea (adult) (pediatric): Secondary | ICD-10-CM

## 2019-05-01 DIAGNOSIS — M1990 Unspecified osteoarthritis, unspecified site: Secondary | ICD-10-CM

## 2019-05-01 DIAGNOSIS — I509 Heart failure, unspecified: Secondary | ICD-10-CM

## 2019-05-01 DIAGNOSIS — N400 Enlarged prostate without lower urinary tract symptoms: Secondary | ICD-10-CM

## 2019-05-01 DIAGNOSIS — M549 Dorsalgia, unspecified: Secondary | ICD-10-CM

## 2019-05-01 DIAGNOSIS — I5042 Chronic combined systolic (congestive) and diastolic (congestive) heart failure: Secondary | ICD-10-CM

## 2019-05-01 DIAGNOSIS — I2510 Atherosclerotic heart disease of native coronary artery without angina pectoris: Secondary | ICD-10-CM

## 2019-05-01 DIAGNOSIS — C259 Malignant neoplasm of pancreas, unspecified: Secondary | ICD-10-CM

## 2019-05-01 DIAGNOSIS — Z95810 Presence of automatic (implantable) cardiac defibrillator: Secondary | ICD-10-CM

## 2019-05-01 DIAGNOSIS — I10 Essential (primary) hypertension: Secondary | ICD-10-CM

## 2019-05-01 DIAGNOSIS — K219 Gastro-esophageal reflux disease without esophagitis: Secondary | ICD-10-CM

## 2019-05-01 DIAGNOSIS — E119 Type 2 diabetes mellitus without complications: Secondary | ICD-10-CM

## 2019-05-01 DIAGNOSIS — I472 Ventricular tachycardia: Secondary | ICD-10-CM

## 2019-05-01 DIAGNOSIS — J449 Chronic obstructive pulmonary disease, unspecified: Secondary | ICD-10-CM

## 2019-05-01 NOTE — Patient Instructions
Overall things look really good.  Your heart function improved after you came in seriously close to death.  The medications that you are taking improve your survival and continue to improve it.  The Jardiance you are taking for diabetes is a pill that also helps survival with weak heart such as ureters.  Know you are unhappy with these meds and the number of them but you are on several medications that have been shown to keep people out of the hospital and keep them alive both from the diabetes treatment and the heart treatment so stay with him.  I will look forward to seeing you in a year.  I do not think we need to bring you in more often than that since she seemed to be doing well and I am not not making any complicated med changes now.

## 2019-05-01 NOTE — Progress Notes
Date of Service: 05/01/2019    Dustin Blackwell is a 73 y.o. male.       HPI     Dustin Blackwell returns for follow-up of his heart failure with reduced ejection fraction.  He presented with ventricular tachycardia and profound heart failure with EF 20 to 25%.  He got a dual-chamber ICD implanted very early in his course at [facility] because it was needed for secondary prevention.  He was diuresed and started on medical therapy with follow-up as an outpatient here in [place].  With up titration to target dose of carvedilol and good compliance with his other meds and stopping smoking his EF normalized by June 2019 about 2-1/2 years after he presented and extremis.  Since then he has stayed on his meds he stopped smoking and was satisfied with his quality of life.  His main physical activity is fishing.  To hot right now but earlier this year he is doing well fishing from the bank.  His mobility is good enough for him to go up and down the bank without being stuck just fishing from a peer.  Is not having nighttime symptoms.  He has a history of  pancreatic cancer but seems to be doing well.     He has had no focal motor defects or other symptoms that would suggest TIA. He  is tolerating all of his medications well, is not missing any doses and has no specific complaints about any of them.  He reports dismay over the number of medications he is taking but he tolerates them individually       Vitals:    05/01/19 1515 05/01/19 1521   BP: 108/66 110/64   BP Source: Arm, Left Upper Arm, Right Upper   Pulse: 84    SpO2: 98%    Weight: 67.6 kg (149 lb)    Height: 1.727 m (5' 8)    PainSc: Zero      Body mass index is 22.66 kg/m???.     Past Medical History  Patient Active Problem List    Diagnosis Date Noted   ??? 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 Cholecystitis 09/25/2018   ??? AKI (acute kidney injury) (HCC) 09/25/2018   ??? Stage 3 chronic kidney disease (HCC) 12/13/2017 ??? Chronic combined systolic and diastolic CHF (congestive heart failure) (HCC) 12/09/2015     10/25/15 [facility] Xfr  from Atch w/ HF/VT:  Echo LV EF  20-25% .Normal LV  size. Akinetic base to mid inferior and lateral walls o/w diffuse hypokinesis. Inad TR for PAP  1/10 Dual chamber ICD implant St Jude  10/29/2015: Dismissal after 9 pound diuresis. Carvedilol 5.25 BID, lisinopirl 5, amiodarone 200, atorvastatin 200.  post dismissal cough with lisinopril, switch to losartan.   03/2016: Echo EF 45% on carvedilol 25 BID,   03/22/18: EF 65% .normal valves, normal diastolic function, Mild concentric LVH on spiro, coreg, losartain. No SX.      ??? ICD (implantable cardioverter-defibrillator), dual, in situ 12/09/2015     10/28/2015: Dual Chamber ICD Implant: St. Jude Fortify [employer], Dr. Kathleene Hazel        ??? Coronary artery disease involving native coronary artery of native heart 10/27/2015     10/27/15: Cath - 100% OM1, 70% distal RCA  Cath [facility] at time of presentation w/ ADHF, No PCI.      ??? Type 2 diabetes mellitus with complication, without  long-term current use of insulin (HCC) 10/25/2015   ??? 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  Amiodarone 100 mg/day DC'd      ??? Tobacco abuse 10/25/2015     Stopped smoking 10/25/2015 at time of DX cardiomyopathy and ventricular tachycardia      ??? Essential hypertension 10/25/2015     Dx approx 2013 at  time of Dx diabetes mellitus      ??? Hyperlipidemia 10/25/2015     Initial fasting lipid panel at time preesentation w/ ADHF, Atorvastatin 40 begun,  DC'd then resumed 06/29/16   10/25/2015   Cholesterol 147   Triglycerides 140   HDL 51   LDL 73              Review of Systems   Constitution: Positive for weight loss.   HENT: Negative. Eyes: Positive for vision loss in right eye.   Cardiovascular: Negative.    Respiratory: Negative.    Endocrine: Negative.    Hematologic/Lymphatic: Negative.    Skin: Negative.    Musculoskeletal: Positive for back pain.   Gastrointestinal: Negative.    Genitourinary: Negative.    Neurological: Negative.    Psychiatric/Behavioral: Negative.    Allergic/Immunologic: Negative.    14 organ system review noted. It is negative except as reported in current narrative or above in the ROS section. This is a patient centered review of systems that was stated by the patient in his terms prior to my personal problem oriented interview with the patient     Physical Exam  General: Patient in no distress, looks generally healthy. Skin warm and dry.    Mucous membranes moist.  Eyes: Sclera non icteric,Pupils equal and round    Carotids: no bruits    Thyroid not enlarged.  Neck veins: CVP 6-8 normal, no V wave, no HJR   Left delto-pectoral electronic pulse generator, not tender  Respiratory: Breathing comfortably. Lungs clear to percussion & auscultation. No rales, rhonchi or wheezing   Cardiac: Regular rhythm. LV impulse not palpable. Normal S1 & S2, Fourth heart sound, no rub or S3. No murmur  Abdomen: soft, non-tender, no masses,bruits,hepatic or aortic enlargement. + bowel sounds.   Femoral arteries: Good pulses, no bruits.  Legs/feet: Normal PT pulses, no edema.   Motor: Normal muscle strength. Cognitive: Pleasant demeanor. Good insight. No depression     Cardiovascular Studies  EKG shows sinus rhythm rate 69 diffuse T wave flattening.    Problems Addressed Today  No diagnosis found.    Assessment and Plan     Dustin Blackwell is doing well.  He is able to do the activities of daily living that he enjoys.  Until it became too hot to [place]  catfish.  He is not happy about having to take so many medications but he understands it is important and allowed his heart function to improve to normal and stay alive.  He is on empagliflozin for his diabetes which is important medication for avoiding complications and improving survival.  I really emphasized to him the importance of his meds.  I do not need to change the thing today as his volume status looks good.  My general policy for people with compensated heart failure  such as him with no problems or med changes it to see them once a year.      NB: The free text in this document was generated through Dragon(TM) software with editing and proofreading  done by the author of this document Dr. Mable Paris MD, [facility] principally at the point of care. Some errors may persist.  If there are questions about content in this document please contact Dustin Blackwell.    The written information I provided Dustin Blackwell at the conclusion of today's encounter is as  follows:    Patient Instructions   Overall things look really good.  Your heart function improved after you came in seriously close to death.  The medications that you are taking improve your survival and continue to improve it.  The Jardiance you are taking for diabetes is a pill that also helps survival with weak heart such as ureters.  Know you are unhappy with these meds and the number of them but you are on several medications that have been shown to keep people out of the hospital and keep them alive both from the diabetes treatment and the heart treatment so stay with him.  I will look forward to seeing you in a year.  I do not think we need to bring you in more often than that since she seemed to be doing well and I am not not making any complicated med changes now.             Current Medications (including today's revisions)  ??? acetaminophen/lidocaine/antacid DS(#) (GI COCKTAIL) 1:1:3 Take 30 mL by mouth every 3 hours as needed.   ??? allopurinol (ZYLOPRIM) 300 mg tablet Take 1 tablet by mouth daily. ??? aspirin EC 81 mg tablet Take 81 mg by mouth daily. Take with food.   ??? atorvastatin (LIPITOR) 40 mg tablet TAKE 1 TABLET BY MOUTH ONCE DAILY AT BEDTIME   ??? carvediloL (COREG) 25 mg tablet TAKE 1 TABLET BY MOUTH TWICE DAILY WITH MEALS **TAKE  WITH  FOOD**   ??? empagliflozin 25 mg tab Take 1 tablet by mouth daily.   ??? levoFLOXacin (LEVAQUIN) 500 mg tablet Take one tablet by mouth every 48 hours. Indications: Severe Allergy to Beta-Lactam Antibiotic   ??? metFORMIN (GLUCOPHAGE) 1,000 mg tablet Take 1,000 mg by mouth twice daily with meals.   ??? pantoprazole DR (PROTONIX) 40 mg tablet Take 1 tablet by mouth once daily   ??? potassium chloride SR (K-DUR) 20 mEq tablet Take 20 mEq by mouth daily. Take with a meal and a full glass of water.   ??? sitaGLIPtin (JANUVIA) 100 mg tab tablet Take 100 mg by mouth daily.

## 2019-06-07 DIAGNOSIS — I472 Ventricular tachycardia: Secondary | ICD-10-CM

## 2019-06-07 DIAGNOSIS — R109 Unspecified abdominal pain: Principal | ICD-10-CM

## 2019-06-07 DIAGNOSIS — I5042 Chronic combined systolic (congestive) and diastolic (congestive) heart failure: Secondary | ICD-10-CM

## 2019-06-07 DIAGNOSIS — Z95810 Presence of automatic (implantable) cardiac defibrillator: Secondary | ICD-10-CM

## 2019-06-08 ENCOUNTER — Encounter: Admit: 2019-06-08 | Discharge: 2019-06-08

## 2019-06-08 DIAGNOSIS — I5042 Chronic combined systolic (congestive) and diastolic (congestive) heart failure: Secondary | ICD-10-CM

## 2019-06-08 DIAGNOSIS — I472 Ventricular tachycardia: Secondary | ICD-10-CM

## 2019-06-08 DIAGNOSIS — Z95810 Presence of automatic (implantable) cardiac defibrillator: Secondary | ICD-10-CM

## 2019-06-10 ENCOUNTER — Encounter: Admit: 2019-06-10 | Discharge: 2019-06-10

## 2019-06-10 ENCOUNTER — Encounter: Admit: 2019-06-10 | Discharge: 2019-06-11

## 2019-06-10 DIAGNOSIS — A419 Sepsis, unspecified organism: Secondary | ICD-10-CM

## 2019-06-10 DIAGNOSIS — C259 Malignant neoplasm of pancreas, unspecified: Secondary | ICD-10-CM

## 2019-06-10 DIAGNOSIS — E119 Type 2 diabetes mellitus without complications: Secondary | ICD-10-CM

## 2019-06-10 DIAGNOSIS — J449 Chronic obstructive pulmonary disease, unspecified: Secondary | ICD-10-CM

## 2019-06-10 DIAGNOSIS — R570 Cardiogenic shock: Secondary | ICD-10-CM

## 2019-06-10 DIAGNOSIS — R109 Unspecified abdominal pain: Secondary | ICD-10-CM

## 2019-06-10 DIAGNOSIS — K219 Gastro-esophageal reflux disease without esophagitis: Secondary | ICD-10-CM

## 2019-06-10 DIAGNOSIS — N179 Acute kidney failure, unspecified: Secondary | ICD-10-CM

## 2019-06-10 DIAGNOSIS — M1990 Unspecified osteoarthritis, unspecified site: Secondary | ICD-10-CM

## 2019-06-10 DIAGNOSIS — I509 Heart failure, unspecified: Secondary | ICD-10-CM

## 2019-06-10 DIAGNOSIS — N400 Enlarged prostate without lower urinary tract symptoms: Secondary | ICD-10-CM

## 2019-06-10 DIAGNOSIS — M549 Dorsalgia, unspecified: Secondary | ICD-10-CM

## 2019-06-10 DIAGNOSIS — C250 Malignant neoplasm of head of pancreas: Secondary | ICD-10-CM

## 2019-06-10 DIAGNOSIS — I10 Essential (primary) hypertension: Secondary | ICD-10-CM

## 2019-06-10 DIAGNOSIS — G4733 Obstructive sleep apnea (adult) (pediatric): Secondary | ICD-10-CM

## 2019-06-10 LAB — COMPREHENSIVE METABOLIC PANEL
Lab: 1.5 mg/dL — ABNORMAL HIGH (ref 0.4–1.24)
Lab: 103 MMOL/L — ABNORMAL LOW (ref 98–110)
Lab: 136 MMOL/L — ABNORMAL LOW (ref 137–147)
Lab: 16 MMOL/L — ABNORMAL LOW (ref 21–30)
Lab: 17 10*3/uL — ABNORMAL HIGH (ref 3–12)
Lab: 186 U/L — ABNORMAL HIGH (ref 7–56)
Lab: 247 U/L — ABNORMAL HIGH (ref 7–40)
Lab: 37 mg/dL — ABNORMAL HIGH (ref 7–25)
Lab: 4.1 g/dL — ABNORMAL HIGH (ref 3.5–5.0)
Lab: 4.3 mg/dL — ABNORMAL HIGH (ref 0.3–1.2)
Lab: 45 mL/min — ABNORMAL LOW (ref >60)
Lab: 54 mL/min — ABNORMAL LOW (ref >60)
Lab: 827 U/L — ABNORMAL HIGH (ref 25–110)
Lab: 9.2 g/dL — ABNORMAL HIGH (ref 6.0–8.0)
Lab: 9.9 mg/dL — ABNORMAL HIGH (ref 8.5–10.6)

## 2019-06-10 LAB — CBC AND DIFF
Lab: 0 10*3/uL (ref 0–0.20)
Lab: 9.9 10*3/uL (ref 4.5–11.0)

## 2019-06-10 LAB — COVID-19 (SARS-COV-2) PCR

## 2019-06-10 LAB — POC GLUCOSE
Lab: 197 mg/dL — ABNORMAL HIGH (ref 70–100)
Lab: 186 mg/dL — ABNORMAL HIGH (ref 70–100)

## 2019-06-10 LAB — LIPASE: Lab: 345 U/L — ABNORMAL HIGH (ref 11–82)

## 2019-06-10 LAB — LACTIC ACID(LACTATE): Lab: 1.8 MMOL/L (ref 0.5–2.0)

## 2019-06-10 MED ORDER — CEFEPIME 2G/100ML NS IVPB (MB+)
2 g | Freq: Two times a day (BID) | INTRAVENOUS | 0 refills | Status: DC
Start: 2019-06-10 — End: 2019-06-11
  Administered 2019-06-10 – 2019-06-11 (×4): 2 g via INTRAVENOUS

## 2019-06-10 MED ORDER — FENTANYL CITRATE (PF) 50 MCG/ML IJ SOLN
25 ug | INTRAVENOUS | 0 refills | Status: DC | PRN
Start: 2019-06-10 — End: 2019-06-10
  Administered 2019-06-10: 15:00:00 25 ug via INTRAVENOUS

## 2019-06-10 MED ORDER — OXYCODONE 5 MG PO TAB
5 mg | ORAL | 0 refills | Status: DC | PRN
Start: 2019-06-10 — End: 2019-06-11
  Administered 2019-06-10 – 2019-06-11 (×2): 5 mg via ORAL

## 2019-06-10 MED ORDER — ACETAMINOPHEN 325 MG PO TAB
650 mg | ORAL | 0 refills | Status: DC | PRN
Start: 2019-06-10 — End: 2019-06-11

## 2019-06-10 MED ORDER — ASPIRIN 81 MG PO TBEC
81 mg | Freq: Every day | ORAL | 0 refills | Status: DC
Start: 2019-06-10 — End: 2019-06-20
  Administered 2019-06-10 – 2019-06-20 (×11): 81 mg via ORAL

## 2019-06-10 MED ORDER — PANTOPRAZOLE 40 MG PO TBEC
40 mg | Freq: Every day | ORAL | 0 refills | Status: DC
Start: 2019-06-10 — End: 2019-06-20
  Administered 2019-06-10 – 2019-06-19 (×10): 40 mg via ORAL

## 2019-06-10 MED ORDER — POLYETHYLENE GLYCOL 3350 17 GRAM PO PWPK
1 | Freq: Every day | ORAL | 0 refills | Status: DC | PRN
Start: 2019-06-10 — End: 2019-06-20
  Administered 2019-06-11 – 2019-06-13 (×2): 17 g via ORAL

## 2019-06-10 MED ORDER — ATORVASTATIN 40 MG PO TAB
40 mg | Freq: Every evening | ORAL | 0 refills | Status: DC
Start: 2019-06-10 — End: 2019-06-11

## 2019-06-10 MED ORDER — INSULIN ASPART 100 UNIT/ML SC FLEXPEN
0-7 [IU] | Freq: Before meals | SUBCUTANEOUS | 0 refills | Status: DC
Start: 2019-06-10 — End: 2019-06-11
  Administered 2019-06-11: 06:00:00 1 [IU] via SUBCUTANEOUS

## 2019-06-10 MED ORDER — ACETAMINOPHEN/LIDOCAINE/ANTACID DS(#) 1:1:3  PO SUSP
30 mL | ORAL | 0 refills | Status: DC | PRN
Start: 2019-06-10 — End: 2019-06-20
  Administered 2019-06-12 – 2019-06-19 (×22): 30 mL via ORAL

## 2019-06-10 MED ORDER — ALLOPURINOL 100 MG PO TAB
100 mg | Freq: Every day | ORAL | 0 refills | Status: DC
Start: 2019-06-10 — End: 2019-06-15
  Administered 2019-06-10 – 2019-06-15 (×6): 100 mg via ORAL

## 2019-06-10 MED ORDER — POTASSIUM CHLORIDE 20 MEQ PO TBTQ
20 meq | Freq: Every day | ORAL | 0 refills | Status: DC
Start: 2019-06-10 — End: 2019-06-10

## 2019-06-10 MED ORDER — CARVEDILOL 25 MG PO TAB
25 mg | Freq: Two times a day (BID) | ORAL | 0 refills | Status: DC
Start: 2019-06-10 — End: 2019-06-11
  Administered 2019-06-10 – 2019-06-11 (×2): 25 mg via ORAL

## 2019-06-10 MED ORDER — OXYCODONE 5 MG PO TAB
5 mg | ORAL | 0 refills | Status: DC | PRN
Start: 2019-06-10 — End: 2019-06-10
  Administered 2019-06-10: 14:00:00 5 mg via ORAL

## 2019-06-10 MED ORDER — CARVEDILOL 6.25 MG PO TAB
6.25 mg | Freq: Two times a day (BID) | ORAL | 0 refills | Status: DC
Start: 2019-06-10 — End: 2019-06-10

## 2019-06-10 MED ORDER — HEPARIN, PORCINE (PF) 5,000 UNIT/0.5 ML IJ SYRG
5000 [IU] | SUBCUTANEOUS | 0 refills | Status: DC
Start: 2019-06-10 — End: 2019-06-14
  Administered 2019-06-10 – 2019-06-14 (×11): 5000 [IU] via SUBCUTANEOUS

## 2019-06-10 MED ORDER — METRONIDAZOLE 500 MG PO TAB
500 mg | Freq: Three times a day (TID) | ORAL | 0 refills | Status: DC
Start: 2019-06-10 — End: 2019-06-11
  Administered 2019-06-10 – 2019-06-11 (×2): 500 mg via ORAL

## 2019-06-10 MED ORDER — SODIUM CHLORIDE 0.9 % IV SOLP
INTRAVENOUS | 0 refills | Status: AC
Start: 2019-06-10 — End: ?
  Administered 2019-06-10: 15:00:00 1000.000 mL via INTRAVENOUS

## 2019-06-10 MED ORDER — SODIUM CHLORIDE 0.9 % IV SOLP
1000 mL | INTRAVENOUS | 0 refills | Status: DC
Start: 2019-06-10 — End: 2019-06-15
  Administered 2019-06-10 – 2019-06-11 (×2): 1000 mL via INTRAVENOUS

## 2019-06-10 NOTE — Progress Notes
Patient arrived on unit via wheelchair accompanied by transport. Patient transferred to the bed with assistance. Assessment completed, refer to flowsheet for details. Orders released, reviewed, and implemented as appropriate. Oriented to surroundings, call light within reach. Plan of care reviewed.  Will continue to monitor and assess.

## 2019-06-10 NOTE — H&P (View-Only)
Pre Procedure History and Physical/Sedation Plan    Name:Dustin Blackwell                                                                   MRN: [id number]                 DOB:[birth year]          Age: 73 y.o.  Admission Date: 06/10/2019             Days Admitted: LOS: 0 days      Procedure Date:  06/10/2019    Planned Procedure(s):  GI:  ERCP  Sedation/Medication Plan:  General Anesthesia  Discussion/Reviews:  Physician has discussed risks and alternatives of this type of sedation and above planned procedures with patient  ________________________________________________________________  Chief Complaint:  Inpatient endo consult note reviewed.  Jaundice, abd pain, pancreatic cancer    Previous Anesthetic/Sedation History:  reviewed    Allergies:  Strawberry; Lisinopril; and Penicillins  Medications:  Scheduled Meds:[MAR Hold] allopurinoL (ZYLOPRIM) tablet 100 mg, 100 mg, Oral, QDAY  [MAR Hold] aspirin EC tablet 81 mg, 81 mg, Oral, QDAY  [MAR Hold] atorvastatin (LIPITOR) tablet 40 mg, 40 mg, Oral, QHS  carvediloL (COREG) tablet 25 mg, 25 mg, Oral, BID  [MAR Hold] heparin (porcine) PF syringe 5,000 Units, 5,000 Units, Subcutaneous, Q8H  [MAR Hold] insulin aspart U-100 (NOVOLOG FLEXPEN) injection PEN 0-7 Units, 0-7 Units, Subcutaneous, ACHS  [MAR Hold] pantoprazole DR (PROTONIX) tablet 40 mg, 40 mg, Oral, QDAY    Continuous Infusions:  ??? sodium chloride 0.9 %   infusion 100 mL/hr at 06/10/19 1001   ??? sodium chloride 0.9 %   infusion       PRN and Respiratory Meds:[MAR Hold] acetaminophen Q6H PRN, [MAR Hold] acetaminophen/lidocaine/antacid DS(#) Q3H PRN, [MAR Hold] fentaNYL citrate PF Q4H while awake PRN, [MAR Hold] oxyCODONE Q6H PRN, [MAR Hold] polyethylene glycol 3350 QDAY PRN       Vital Signs:  Last Filed Vital Signs: 24 Hour Range   BP: 123/75 (08/23 0730)  Temp: 36.8 ???C (98.3 ???F) (08/23 0730)  Pulse: 112 (08/23 0730)  Respirations: 18 PER MINUTE (08/23 0730)  SpO2: 97 % (08/23 0730) Height: 172.7 cm (67.99) (08/23 0800) BP: (123)/(75)   Temp:  [36.8 ???C (98.3 ???F)]   Pulse:  [112]   Respirations:  [18 PER MINUTE]   SpO2:  [97 %]      NPO Status:     Airway:  airway assessment performed  Mallampati II (soft palate, uvula, fauces visible)  Anesthesia Classification:  ASA III (A patient with a severe systemic disease that limits activity, but is not incapacitating)  NPO Status: Acceptable  Preganancy Status: Not Pregnant    Lab/Radiology/Other Diagnostic Tests  Labs:  Relevant labs reviewed    Vertell Novak, MD  Pager

## 2019-06-10 NOTE — Care Coordination-Inpatient
Pt assigned to [facility]

## 2019-06-10 NOTE — Progress Notes
Paged [phone] to update on patients condition. Orders received.

## 2019-06-10 NOTE — Progress Notes
Patient lethargic pre procedure questions answered per daughter Claiborne Billings.

## 2019-06-10 NOTE — Progress Notes
Report received from [place], RN, [facility] ED, pt to be transferred via EMS. Pt arrived to unit via EMS, transferred to bed prior to RN arrival to room, VSS, pt resting in bed, pain 4/10, RUQ, LUQ, AOD paged, report given to day RN, Bethena Roys; awaiting orders.

## 2019-06-10 NOTE — Progress Notes
Patient reports the last time he had anything to eat was Friday at 2pm.

## 2019-06-10 NOTE — Progress Notes
Patient arrived to room [id number]) via wheelchair accompanied by transport. Patient transferred to the bed with assistance. Bedside safety checks completed. Initial patient assessment completed. Refer to flowsheet for details.    Admission skin assessment completed with: Morey Hummingbird, RN    Pressure injury present on arrival?: No    1. Head/Face/Neck: No  2. Trunk/Back: No  3. Upper Extremities: No  4. Lower Extremities: No  5. Pelvic/Coccyx: No , scab on inner buttocks.  6. Assessed for device associated injury? No  7. Malnutrition Screening Tool (Nursing Nutrition Assessment) Completed? No    See Doc Flowsheet for additional wound details.     INTERVENTIONS:

## 2019-06-11 ENCOUNTER — Encounter: Admit: 2019-06-11 | Discharge: 2019-06-11

## 2019-06-11 ENCOUNTER — Encounter: Admit: 2019-06-11 | Discharge: 2019-06-12

## 2019-06-11 DIAGNOSIS — R109 Unspecified abdominal pain: Secondary | ICD-10-CM

## 2019-06-11 LAB — POC IONIZED CALCIUM
Lab: 1.1 MMOL/L (ref 1.0–1.3)
Lab: 1.2 MMOL/L (ref 1.0–1.3)

## 2019-06-11 LAB — COMPREHENSIVE METABOLIC PANEL
Lab: 107 MMOL/L — ABNORMAL LOW (ref 98–110)
Lab: 136 MMOL/L — ABNORMAL LOW (ref 137–147)
Lab: 14 MMOL/L — ABNORMAL LOW (ref 21–30)
Lab: 178 U/L — ABNORMAL HIGH (ref 7–56)
Lab: 184 mg/dL — ABNORMAL HIGH (ref 70–100)
Lab: 20 mL/min — ABNORMAL LOW (ref >60)
Lab: 225 U/L — ABNORMAL HIGH (ref 7–40)
Lab: 24 mL/min — ABNORMAL LOW (ref >60)
Lab: 3.1 g/dL — ABNORMAL LOW (ref 3.5–5.0)
Lab: 3.1 mg/dL — ABNORMAL HIGH (ref 0.4–1.24)
Lab: 4.3 mg/dL — ABNORMAL HIGH (ref 0.3–1.2)
Lab: 50 mg/dL — ABNORMAL HIGH (ref 7–25)
Lab: 553 U/L — ABNORMAL HIGH (ref 25–110)
Lab: 7.1 g/dL — ABNORMAL LOW (ref 6.0–8.0)
Lab: 8.3 mg/dL — ABNORMAL LOW (ref 8.5–10.6)
Lab: 15 — ABNORMAL HIGH (ref 3–12)
Lab: 110 MMOL/L — ABNORMAL LOW (ref 98–110)
Lab: 12 MMOL/L — ABNORMAL LOW (ref 21–30)
Lab: 138 MMOL/L — ABNORMAL LOW (ref 137–147)
Lab: 16 10*3/uL — ABNORMAL HIGH (ref 3–12)
Lab: 185 U/L — ABNORMAL HIGH (ref 7–56)
Lab: 196 mg/dL — ABNORMAL HIGH (ref 70–100)
Lab: 2.7 mg/dL — ABNORMAL HIGH (ref 0.4–1.24)
Lab: 2.8 g/dL — ABNORMAL LOW (ref >60)
Lab: 23 mL/min — ABNORMAL LOW (ref >60)
Lab: 253 U/L — ABNORMAL HIGH (ref 7–40)
Lab: 28 mL/min — ABNORMAL LOW (ref >60)
Lab: 4.3 mg/dL — ABNORMAL HIGH (ref >60)
Lab: 48 mg/dL — ABNORMAL HIGH (ref 7–25)
Lab: 5.3 MMOL/L — ABNORMAL HIGH (ref 3.5–5.1)
Lab: 506 U/L — ABNORMAL HIGH (ref 25–110)
Lab: 6.4 g/dL — ABNORMAL HIGH (ref 6.0–8.0)
Lab: 7.8 mg/dL — ABNORMAL LOW (ref 8.5–10.6)

## 2019-06-11 LAB — LACTIC ACID (BG - RAPID LACTATE)
Lab: 3.9 MMOL/L — ABNORMAL HIGH (ref 0.5–2.0)
Lab: 5.6 MMOL/L — ABNORMAL HIGH (ref 0.5–2.0)
Lab: 5.8 MMOL/L — ABNORMAL HIGH (ref 0.5–2.0)
Lab: 6.4 MMOL/L — ABNORMAL HIGH (ref 0.5–2.0)
Lab: 8.7 MMOL/L — ABNORMAL HIGH (ref 0.5–2.0)

## 2019-06-11 LAB — BLOOD GASES, MIXED VENOUS
Lab: 12 MMOL/L
Lab: 14 MMOL/L
Lab: 27 mmHg — ABNORMAL LOW (ref 40–45)
Lab: 33 mmHg — ABNORMAL LOW (ref >40)
Lab: 50 %
Lab: 7.2 — ABNORMAL LOW (ref 7.30–7.40)

## 2019-06-11 LAB — URINALYSIS DIPSTICK REFLEX TO CULTURE
Lab: NEGATIVE
Lab: NEGATIVE
Lab: NEGATIVE
Lab: NEGATIVE

## 2019-06-11 LAB — LACTIC ACID(LACTATE)
Lab: 5.2 MMOL/L — ABNORMAL HIGH (ref 0.5–2.0)
Lab: 7.4 MMOL/L — ABNORMAL HIGH (ref 0.5–2.0)

## 2019-06-11 LAB — BASIC METABOLIC PANEL
Lab: 11 MMOL/L — ABNORMAL LOW (ref 21–30)
Lab: 111 MMOL/L — ABNORMAL HIGH (ref 98–110)
Lab: 138 MMOL/L (ref 137–147)
Lab: 178 mg/dL — ABNORMAL HIGH (ref 70–100)
Lab: 2.4 mg/dL — ABNORMAL HIGH (ref 0.4–1.24)
Lab: 26 mL/min — ABNORMAL LOW (ref >60)
Lab: 31 mL/min — ABNORMAL LOW (ref >60)
Lab: 50 mg/dL — ABNORMAL HIGH (ref 7–25)
Lab: 7.8 mg/dL — ABNORMAL LOW (ref 8.5–10.6)
Lab: 16 — ABNORMAL HIGH (ref 3–12)
Lab: 10 MMOL/L — ABNORMAL LOW (ref 21–30)
Lab: 110 MMOL/L (ref 98–110)
Lab: 111 MMOL/L — ABNORMAL HIGH (ref 98–110)
Lab: 137 MMOL/L — ABNORMAL LOW (ref 137–147)
Lab: 138 MMOL/L — ABNORMAL LOW (ref 137–147)
Lab: 17 MMOL/L — ABNORMAL HIGH (ref 3–12)
Lab: 18 g/dL — ABNORMAL HIGH (ref 3–12)
Lab: 2.3 mg/dL — ABNORMAL HIGH (ref 0.4–1.24)
Lab: 236 mg/dL — ABNORMAL HIGH (ref 70–100)
Lab: 27 mL/min — ABNORMAL LOW (ref >60)
Lab: 33 mL/min — ABNORMAL LOW (ref >60)
Lab: 4.4 MMOL/L (ref 3.5–5.1)
Lab: 51 mg/dL — ABNORMAL HIGH (ref 7–25)
Lab: 7.4 mg/dL — ABNORMAL LOW (ref 8.5–10.6)

## 2019-06-11 LAB — URINALYSIS MICROSCOPIC REFLEX TO CULTURE

## 2019-06-11 LAB — CBC AND DIFF
Lab: 18 10*3/uL — ABNORMAL HIGH (ref 4.5–11.0)
Lab: 24 pg — ABNORMAL LOW (ref 26–34)
Lab: 27 % — ABNORMAL LOW (ref 40–50)
Lab: 79 FL — ABNORMAL LOW (ref 80–100)
Lab: 8.4 g/dL — ABNORMAL LOW (ref 13.5–16.5)

## 2019-06-11 LAB — POC SODIUM
Lab: 139 MMOL/L (ref 137–147)
Lab: 141 MMOL/L (ref 137–147)

## 2019-06-11 LAB — TROPONIN-I
Lab: 0 ng/mL (ref 0.0–0.05)
Lab: 0.1 ng/mL — ABNORMAL HIGH (ref 0.0–0.05)

## 2019-06-11 LAB — POC BLOOD GAS ARTERIAL
Lab: 15 MMOL/L
Lab: 16 mmHg — CL (ref 35–45)
Lab: 7.3 (ref 7.35–7.45)
Lab: 10 MMOL/L — ABNORMAL LOW (ref 21–28)
Lab: 28 mmHg — ABNORMAL LOW (ref 35–45)
Lab: 7.1 K/UL — CL (ref 7.35–7.45)

## 2019-06-11 LAB — POC HEMATOCRIT
Lab: 28 % — ABNORMAL LOW (ref 40–50)
Lab: 9.5 g/dL — ABNORMAL LOW (ref 13.5–16.5)
Lab: 26 % — ABNORMAL LOW (ref 40–50)
Lab: 8.8 g/dL — ABNORMAL LOW (ref 13.5–16.5)

## 2019-06-11 LAB — SODIUM-URINE RANDOM: Lab: 39 MMOL/L (ref 3.5–5.1)

## 2019-06-11 LAB — CREATININE-URINE RANDOM: Lab: 75 mg/dL

## 2019-06-11 LAB — POC GLUCOSE
Lab: 188 mg/dL — ABNORMAL HIGH (ref 70–100)
Lab: 193 mg/dL — ABNORMAL HIGH (ref 70–100)
Lab: 160 mg/dL — ABNORMAL HIGH (ref 70–100)
Lab: 163 mg/dL — ABNORMAL HIGH (ref 70–100)
Lab: 173 mg/dL — ABNORMAL HIGH (ref 70–100)
Lab: 212 mg/dL — ABNORMAL HIGH (ref 70–100)

## 2019-06-11 LAB — CBC
Lab: 18 10*3/uL — ABNORMAL HIGH (ref 4.5–11.0)
Lab: 5.5 10*3/uL (ref 4.5–11.0)

## 2019-06-11 LAB — POC POTASSIUM
Lab: 5.4 MMOL/L — ABNORMAL HIGH (ref 3.5–5.1)
Lab: 5.5 MMOL/L — ABNORMAL HIGH (ref 3.5–5.1)

## 2019-06-11 LAB — PHOSPHORUS: Lab: 2.4 mg/dL (ref 2.0–4.5)

## 2019-06-11 LAB — BLOOD GASES, ARTERIAL: Lab: 7.3 — ABNORMAL LOW (ref 7.35–7.45)

## 2019-06-11 LAB — MAGNESIUM: Lab: 1.2 mg/dL — ABNORMAL LOW (ref 1.6–2.6)

## 2019-06-11 LAB — PROCALCITONIN: Lab: >10 ng/mL — ABNORMAL HIGH (ref <0.11)

## 2019-06-11 MED ORDER — VANCOMYCIN RANDOM DOSING
1 | INTRAVENOUS | 0 refills | Status: DC
Start: 2019-06-11 — End: 2019-06-11

## 2019-06-11 MED ORDER — EPINEPHRINE 0.1MG NS 10ML SYR (AM)(OR)
0 refills | Status: DC
Start: 2019-06-11 — End: 2019-06-11
  Administered 2019-06-11 (×2): 10 ug via INTRAVENOUS

## 2019-06-11 MED ORDER — MEROPENEM 2G/140ML NS IVPB (EXTENDED INFUSION)
2 g | INTRAVENOUS | 0 refills | Status: DC
Start: 2019-06-11 — End: 2019-06-11

## 2019-06-11 MED ORDER — LIDOCAINE HCL 4 % LATR SOLN
0 refills | Status: DC
Start: 2019-06-11 — End: 2019-06-11
  Administered 2019-06-11: 17:00:00 2 mL via TOPICAL

## 2019-06-11 MED ORDER — IOPAMIDOL 61 % IV SOLN
0 refills | Status: DC
Start: 2019-06-11 — End: 2019-06-11
  Administered 2019-06-11: 18:00:00 50 mL via INTRAMUSCULAR

## 2019-06-11 MED ORDER — PERFLUTREN LIPID MICROSPHERES 1.1 MG/ML IV SUSP
1-20 mL | Freq: Once | INTRAVENOUS | 0 refills | Status: CP | PRN
Start: 2019-06-11 — End: ?
  Administered 2019-06-11: 20:00:00 2 mL via INTRAVENOUS

## 2019-06-11 MED ORDER — VANCOMYCIN PHARMACY TO MANAGE
1 | 0 refills | Status: DC
Start: 2019-06-11 — End: 2019-06-11

## 2019-06-11 MED ORDER — VANCOMYCIN (15-40KG) 250ML IVPB
15 mg/kg | INTRAVENOUS | 0 refills | Status: DC
Start: 2019-06-11 — End: 2019-06-11

## 2019-06-11 MED ORDER — ADENOSINE 3 MG/ML IV SYRG
12 mg | Freq: Once | INTRAVENOUS | 0 refills | Status: CP
Start: 2019-06-11 — End: ?
  Administered 2019-06-11: 11:00:00 12 mg via INTRAVENOUS

## 2019-06-11 MED ORDER — DEXMEDETOMIDINE# 4MCG/ML IV SOLN
0 refills | Status: DC
Start: 2019-06-11 — End: 2019-06-11
  Administered 2019-06-11 (×10): 4 ug via INTRAVENOUS

## 2019-06-11 MED ORDER — NOREPINEPHRINE 64 MCG/ML IN D5W IV DRIP (QUAD CONC)
0-.5 ug/kg/min | INTRAVENOUS | 0 refills | Status: DC
Start: 2019-06-11 — End: 2019-06-14
  Administered 2019-06-11 (×2): 0.35 ug/kg/min via INTRAVENOUS
  Administered 2019-06-12: 12:00:00 0.3 ug/kg/min via INTRAVENOUS
  Administered 2019-06-12 (×2): 0.5 ug/kg/min via INTRAVENOUS
  Administered 2019-06-12: 12:00:00 0.3 ug/kg/min via INTRAVENOUS
  Administered 2019-06-13 (×2): 0.18 ug/kg/min via INTRAVENOUS

## 2019-06-11 MED ORDER — VASOPRESSIN 20 UNIT/ML IV SOLN
0 refills | Status: DC
Start: 2019-06-11 — End: 2019-06-11
  Administered 2019-06-11: 17:00:00 1 [IU] via INTRAVENOUS

## 2019-06-11 MED ORDER — LINEZOLID IN DEXTROSE 5% 600 MG/300 ML IV PGBK
600 mg | Freq: Two times a day (BID) | INTRAVENOUS | 0 refills | Status: DC
Start: 2019-06-11 — End: 2019-06-14
  Administered 2019-06-11 – 2019-06-13 (×5): 600 mg via INTRAVENOUS

## 2019-06-11 MED ORDER — MAGNESIUM SULFATE IN D5W 1 GRAM/100 ML IV PGBK
1 g | Freq: Once | INTRAVENOUS | 0 refills | Status: CP
Start: 2019-06-11 — End: ?
  Administered 2019-06-11: 15:00:00 1 g via INTRAVENOUS

## 2019-06-11 MED ORDER — INSULIN ASPART 100 UNIT/ML SC FLEXPEN
0-12 [IU] | Freq: Before meals | SUBCUTANEOUS | 0 refills | Status: DC
Start: 2019-06-11 — End: 2019-06-20
  Administered 2019-06-11: 12:00:00 2 [IU] via SUBCUTANEOUS
  Administered 2019-06-12: 12:00:00 4 [IU] via SUBCUTANEOUS

## 2019-06-11 MED ORDER — LACTATED RINGERS IV SOLP
250 mL | INTRAVENOUS | 0 refills | Status: DC
Start: 2019-06-11 — End: 2019-06-11

## 2019-06-11 MED ORDER — FENTANYL CITRATE (PF) 50 MCG/ML IJ SOLN
50 ug | Freq: Once | INTRAVENOUS | 0 refills | Status: CP
Start: 2019-06-11 — End: ?
  Administered 2019-06-11: 10:00:00 50 ug via INTRAVENOUS

## 2019-06-11 MED ORDER — SODIUM CHLORIDE 0.9 % FLUSH
5-10 mL | Freq: Three times a day (TID) | 0 refills | Status: DC
Start: 2019-06-11 — End: 2019-06-14

## 2019-06-11 MED ORDER — DEXTROSE 50 % IN WATER (D50W) IV SOLP
25 g | Freq: Once | INTRAVENOUS | 0 refills | Status: CP
Start: 2019-06-11 — End: ?
  Administered 2019-06-11: 21:00:00 50 mL via INTRAVENOUS

## 2019-06-11 MED ORDER — KETAMINE 10 MG/ML IJ SOLN
0 refills | Status: DC
Start: 2019-06-11 — End: 2019-06-11
  Administered 2019-06-11: 17:00:00 30 mg via INTRAVENOUS

## 2019-06-11 MED ORDER — PHENYLEPHRINE 40 MCG/ML IN NS IV DRIP (STD CONC)
0-3 ug/kg/min | INTRAVENOUS | 0 refills | Status: DC
Start: 2019-06-11 — End: 2019-06-11
  Administered 2019-06-11: 15:00:00 1.4 ug/kg/min via INTRAVENOUS
  Administered 2019-06-11: 12:00:00 2 ug/kg/min via INTRAVENOUS
  Administered 2019-06-11: 11:00:00 3 ug/kg/min via INTRAVENOUS
  Administered 2019-06-11: 14:00:00 1.8 ug/kg/min via INTRAVENOUS
  Administered 2019-06-11: 15:00:00 1.4 ug/kg/min via INTRAVENOUS
  Administered 2019-06-11: 14:00:00 1.8 ug/kg/min via INTRAVENOUS
  Administered 2019-06-11: 11:00:00 3 ug/kg/min via INTRAVENOUS
  Administered 2019-06-11: 12:00:00 2 ug/kg/min via INTRAVENOUS

## 2019-06-11 MED ORDER — PHENYLEPHRINE 160 MCG/ML IN NS IV DRIP (QUAD CONC)
0-3 ug/kg/min | INTRAVENOUS | 0 refills | Status: DC
Start: 2019-06-11 — End: 2019-06-15
  Administered 2019-06-11 (×2): 1 ug/kg/min via INTRAVENOUS

## 2019-06-11 MED ORDER — EPINEPHRINE 16MCG/ML IN D5W IV DRIP (STD CONC)
0-1 ug/kg/min | INTRAVENOUS | 0 refills | Status: DC
Start: 2019-06-11 — End: 2019-06-14
  Administered 2019-06-11 (×2): 0.1 ug/kg/min via INTRAVENOUS
  Administered 2019-06-11 (×2): 0.28 ug/kg/min via INTRAVENOUS
  Administered 2019-06-12 (×2): 0.2 ug/kg/min via INTRAVENOUS

## 2019-06-11 MED ORDER — NOREPINEPHRINE BITARTRATE-NACL 4 MG/250 ML (16 MCG/ML) IV SOLN
0-.5 ug/kg/min | INTRAVENOUS | 0 refills | Status: DC
Start: 2019-06-11 — End: 2019-06-11

## 2019-06-11 MED ORDER — ADENOSINE 3 MG/ML IV SYRG
6 mg | Freq: Once | INTRAVENOUS | 0 refills | Status: CP
Start: 2019-06-11 — End: ?
  Administered 2019-06-11: 11:00:00 6 mg via INTRAVENOUS

## 2019-06-11 MED ORDER — LACTATED RINGERS IV SOLP
1000 mL | INTRAVENOUS | 0 refills | Status: CP
Start: 2019-06-11 — End: ?
  Administered 2019-06-11: 19:00:00 1000 mL via INTRAVENOUS

## 2019-06-11 MED ORDER — FENTANYL CITRATE (PF) 50 MCG/ML IJ SOLN
12.5 ug | INTRAVENOUS | 0 refills | Status: DC | PRN
Start: 2019-06-11 — End: 2019-06-18
  Administered 2019-06-11 – 2019-06-18 (×3): 12.5 ug via INTRAVENOUS

## 2019-06-11 MED ORDER — INSULIN REGULAR HUMAN(#) 1 UNIT/ML IJ SYRINGE
10 [IU] | Freq: Once | INTRAVENOUS | 0 refills | Status: CP
Start: 2019-06-11 — End: ?
  Administered 2019-06-11: 21:00:00 10 [IU] via INTRAVENOUS

## 2019-06-11 MED ORDER — VANCOMYCIN 1,500 MG IVPB
1500 mg | Freq: Once | INTRAVENOUS | 0 refills | Status: CP
Start: 2019-06-11 — End: ?
  Administered 2019-06-11 (×2): 1500 mg via INTRAVENOUS

## 2019-06-11 MED ORDER — SODIUM ZIRCONIUM CYCLOSILICATE 10 GRAM PO PWPK
10 g | Freq: Once | ORAL | 0 refills | Status: CP
Start: 2019-06-11 — End: ?
  Administered 2019-06-11: 21:00:00 10 g via ORAL

## 2019-06-11 MED ORDER — SODIUM CHLORIDE 0.9 % IV SOLP
1000 mL | INTRAVENOUS | 0 refills | Status: CP
Start: 2019-06-11 — End: ?
  Administered 2019-06-11: 06:00:00 1000 mL via INTRAVENOUS

## 2019-06-11 MED ORDER — LACTATED RINGERS IV SOLP
INTRAVENOUS | 0 refills | Status: CN
Start: 2019-06-11 — End: ?

## 2019-06-11 MED ORDER — LACTATED RINGERS IV SOLP
1000 mL | INTRAVENOUS | 0 refills | Status: CP
Start: 2019-06-11 — End: ?
  Administered 2019-06-11: 09:00:00 1000 mL via INTRAVENOUS

## 2019-06-11 MED ORDER — MEROPENEM IVPB
2 g | Freq: Once | INTRAVENOUS | 0 refills | Status: DC
Start: 2019-06-11 — End: 2019-06-11

## 2019-06-11 MED ORDER — VASOPRESSIN 20 UNITS IN 100ML IV INFUSION
2.4 [IU]/h | INTRAVENOUS | 0 refills | Status: DC
Start: 2019-06-11 — End: 2019-06-15
  Administered 2019-06-11 – 2019-06-12 (×10): 2.4 [IU]/h via INTRAVENOUS

## 2019-06-11 MED ORDER — HYDROCORTISONE SOD SUCC (PF) 100 MG/2 ML IJ SOLR
50 mg | INTRAVENOUS | 0 refills | Status: DC
Start: 2019-06-11 — End: 2019-06-13
  Administered 2019-06-11 – 2019-06-13 (×9): 50 mg via INTRAVENOUS

## 2019-06-11 MED ORDER — NOREPINEPHRINE BITARTRATE-NACL 4 MG/250 ML (16 MCG/ML) IV SOLN
0-.5 ug/kg/min | INTRAVENOUS | 0 refills | Status: DC
Start: 2019-06-11 — End: 2019-06-11
  Administered 2019-06-11: 07:00:00 0.1 ug/kg/min via INTRAVENOUS
  Administered 2019-06-11: 08:00:00 0.5 ug/kg/min via INTRAVENOUS
  Administered 2019-06-11: 13:00:00 0.35 ug/kg/min via INTRAVENOUS
  Administered 2019-06-11: 10:00:00 0.4 ug/kg/min via INTRAVENOUS
  Administered 2019-06-11: 11:00:00 0.5 ug/kg/min via INTRAVENOUS

## 2019-06-11 MED ORDER — MEROPENEM IVP 500MG
500 mg | Freq: Two times a day (BID) | INTRAVENOUS | 0 refills | Status: DC
Start: 2019-06-11 — End: 2019-06-12
  Administered 2019-06-11 – 2019-06-12 (×3): 500 mg via INTRAVENOUS

## 2019-06-11 MED ADMIN — LACTATED RINGERS IV SOLP [4318]: 1000 mL | INTRAVENOUS | @ 09:00:00 | Stop: 2019-06-11 | NDC 00338011704

## 2019-06-11 NOTE — Progress Notes
Pt returns from ERCP. Alert and Oriented x4 but delayed responses, moaning just states he is cold. Pt on 4L NC O2 sats read 89%. Phenyl gtt at 69mcg/kg/min, norepi at .70mcg/kg/min, and vasopressin at 2.4units/hr.

## 2019-06-11 NOTE — Progress Notes
Pharmacy Vancomycin Note  Subjective:   Dustin Blackwell is a 73 y.o. male being treated for Sepsis.    Objective:     Current Vancomycin Orders   Medication Dose Route Frequency    vancomycin (VANCOCIN) 1,500 mg in sodium chloride 0.9% (NS) 280 mL IVPB  1,500 mg Intravenous ONCE    vancomycin, pharmacy to manage  1 each Service Per Pharmacy    vancomycin, random dosing  1 each Intravenous Random Dosing     Start Date of  vancomycin therapy: 06/11/2019  Additional Abx: None  Cultures: Ctx in process  White Blood Cells   Date/Time Value Ref Range Status   06/11/2019 1338 5.5 4.5 - 11.0 K/UL Final   06/11/2019 0600 18.4 (H) 4.5 - 11.0 K/UL Final   06/11/2019 0140 18.6 (H) 4.5 - 11.0 K/UL Final   06/10/2019 0940 9.9 4.5 - 11.0 K/UL Final     Creatinine   Date/Time Value Ref Range Status   06/11/2019 1338 2.41 (H) 0.4 - 1.24 MG/DL Final   06/11/2019 1006 2.48 (H) 0.4 - 1.24 MG/DL Final   06/11/2019 0600 2.71 (H) 0.4 - 1.24 MG/DL Final     Blood Urea Nitrogen   Date/Time Value Ref Range Status   06/11/2019 1338 51 (H) 7 - 25 MG/DL Final     Estimated CrCl: 26.5    Intake/Output Summary (Last 24 hours) at 06/11/2019 1536  Last data filed at 06/11/2019 1450  Gross per 24 hour   Intake 9636.5 ml   Output 850 ml   Net 8786.5 ml      UOP: 350 mL  Actual Weight:  67.6 kg (149 lb)  Dosing BW:  67.6 kg     Assessment:   Target levels for this patient:  1.  AUC (mcg*h/mL):  400-600    Plan:     1. Start one time dose of Vancomycin 1,500 mg. Given patient's fluctuating kidney function and severe illness, dosing by level is approprate. Plan to obtain level tomorrow (8/25) and dose accordingly.  2. Next scheduled level(s): 8/25 @noon   3. Pharmacy will continue to monitor and adjust therapy as needed.    Henreitta Leber, [facility]  06/11/2019

## 2019-06-11 NOTE — Progress Notes
Team notified; pt's blood pressure soft and getting softer. Team on unit to assess pt. 1L LR bolus ordered various labs ordered. 14:08 team remains on unit assessing pt and putting in orders.

## 2019-06-11 NOTE — H&P (View-Only)
Pre Procedure History and Physical/Sedation Plan    Name:Dustin Blackwell                                                                   MRN: [id number]                 DOB:1946/08/04          Age: 73 y.o.  Admission Date: 06/10/2019             Days Admitted: LOS: 1 day      Procedure Date:  06/11/2019    Planned Procedure(s):  GI:  ERCP  Sedation/Medication Plan:  MAC (Monitored Anesthesia Care)  Discussion/Reviews:  Physician has discussed risks and alternatives of this type of sedation and above planned procedures with patient  ________________________________________________________________  Chief Complaint:  Inpatient endo consult note reviewed.      Previous Anesthetic/Sedation History:  MAC    Allergies:  Strawberry; Lisinopril; and Penicillins  Medications:  Scheduled Meds:allopurinoL (ZYLOPRIM) tablet 100 mg, 100 mg, Oral, QDAY  aspirin EC tablet 81 mg, 81 mg, Oral, QDAY  DEXTROSE 5% IN WATER IV SOLP (Cabinet Override), , , NOW  EPINEPHRINE HCL (PF) 1 MG/ML (1 ML) IJ SOLN (Cabinet Override), , , NOW  heparin (porcine) PF syringe 5,000 Units, 5,000 Units, Subcutaneous, Q8H  hydrocortisone PF (Solu-CORTEF) injection 50 mg, 50 mg, Intravenous, Q6H  insulin aspart U-100 (NOVOLOG FLEXPEN) injection PEN 0-12 Units, 0-12 Units, Subcutaneous, ACHS (22)  LACTATED RINGERS IV SOLP (Cabinet Override), , , NOW  meropenem (MERREM) IVP 500 mg, 500 mg, Intravenous, Q12H*  pantoprazole DR (PROTONIX) tablet 40 mg, 40 mg, Oral, QDAY  sodium chloride PF 0.9% flush 5-10 mL, 5-10 mL, Flush, FLUSH TID    Continuous Infusions:  ??? EPINEPHrine (ADRENALIN) 4 mg in dextrose 5% (D5W) 250 mL IV drip (std conc) 0.45 mcg/kg/min (06/11/19 1410)   ??? norepinephrine (LEVOPHED) 16 mg in dextrose 5% (D5W) 250 mL IV drip (quad conc) 0.5 mcg/kg/min (06/11/19 1324)   ??? phenylephrine (NEO-SYNEPHRINE) 80 mg in sodium chloride 0.9% (NS) 500 mL IV drip (quad conc) 3 mcg/kg/min (06/11/19 1324) ??? sodium chloride 0.9 %   infusion 1,000 mL (06/10/19 2151)   ??? vasopressin (VASOSTRICT) 20 Units in sodium chloride 0.9% (NS) 100 mL IV drip (std conc) 2.4 Units/hr (06/11/19 1036)     PRN and Respiratory Meds:acetaminophen/lidocaine/antacid DS(#) Q3H PRN, fentaNYL citrate PF Q2H PRN, polyethylene glycol 3350 QDAY PRN, vancomycin, pharmacy to manage Per Pharmacy       Vital Signs:  Last Filed Vital Signs: 24 Hour Range   BP: 100/77 (08/24 1400)  Temp: 36.5 ???C (97.7 ???F) (08/24 1314)  Pulse: 117 (08/24 1400)  Respirations: 26 PER MINUTE (08/24 1400)  SpO2: 96 % (08/24 1340)  SpO2 Pulse: 62 (08/24 1400) BP: (53-110)/(30-84)   ABP: (58-106)/(36-59)   Temp:  [36.4 ???C (97.5 ???F)-39.6 ???C (103.3 ???F)]   Pulse:  [74-130]   Respirations:  [12 PER MINUTE-34 PER MINUTE]   SpO2:  [90 %-99 %]      NPO Status:     Airway:  Per anesthesia  Anesthesia Classification:  Per Anesthesia  NPO Status: Acceptable  Preganancy Status: N/A    Lab/Radiology/Other Diagnostic Tests  Labs:  Relevant labs reviewed  Kathrynn Ducking, MD  Pager

## 2019-06-11 NOTE — Progress Notes
Notified team plt 92 and potassium 5.9 orders to hold heparin dose for now. Will put in orders for insulin and d50 and EKG

## 2019-06-11 NOTE — Anesthesia Pre-Procedure Evaluation
Anesthesia Pre-Procedure Evaluation    Name: Dustin Blackwell      MRN: [id number]     DOB: [birth year]     Age: 73 y.o.     Sex: male   _________________________________________________________________________     Procedure Date: 06/11/2019   Procedure(s):  ENDOSCOPIC RETROGRADE CHOLANGIOPANCREATOGRAPHY WITH REMOVAL AND EXCHANGE OF STENT BILIARY/ PANCREATIC DUCT - EACH STENT EXCHANGED      Physical Assessment  Vital Signs (last filed in past 24 hours):  BP: 78/61 (08/24 0200)  ABP: 90/57 (08/24 1100)  Temp: 36.4 ???C (97.5 ???F) (08/24 0800)  Pulse: 114 (08/24 1100)  Respirations: 28 PER MINUTE (08/24 1100)  SpO2: 97 % (08/24 1100)  Dosing / Dry Weight: 67.6 kg (149 lb 0.5 oz) (08/24 0100)      Patient History   Allergies   Allergen Reactions   ??? Strawberry RASH and SHORTNESS OF BREATH   ??? Lisinopril CHILLS     no cough with losartan   ??? Penicillins RASH and SHORTNESS OF BREATH        Current Medications    Medication Directions   acetaminophen/lidocaine/antacid DS(#) (GI COCKTAIL) 1:1:3 Take 30 mL by mouth every 3 hours as needed.   allopurinol (ZYLOPRIM) 300 mg tablet Take 1 tablet by mouth daily.   aspirin EC 81 mg tablet Take 81 mg by mouth daily. Take with food.   atorvastatin (LIPITOR) 40 mg tablet TAKE 1 TABLET BY MOUTH ONCE DAILY AT BEDTIME   carvediloL (COREG) 25 mg tablet TAKE 1 TABLET BY MOUTH TWICE DAILY WITH MEALS **TAKE  WITH  FOOD**   empagliflozin 25 mg tab Take 1 tablet by mouth daily.   levoFLOXacin (LEVAQUIN) 500 mg tablet Take one tablet by mouth every 48 hours. Indications: Severe Allergy to Beta-Lactam Antibiotic   metFORMIN (GLUCOPHAGE) 1,000 mg tablet Take 1,000 mg by mouth twice daily with meals.   pantoprazole DR (PROTONIX) 40 mg tablet Take 1 tablet by mouth once daily   potassium chloride SR (K-DUR) 20 mEq tablet Take 20 mEq by mouth daily. Take with a meal and a full glass of water.   sitaGLIPtin (JANUVIA) 100 mg tab tablet Take 100 mg by mouth daily.         Review of Systems/Medical History Patient summary reviewed  Pertinent labs reviewed    PONV Screening: Non-smoker  No history of anesthetic complications  No family history of anesthetic complications        Pulmonary       Not a current smoker (former smoker)        Asthma      COPD, moderate       Sleep apnea      Cardiovascular       Recent diagnostic studies:          echocardiogram          03/22/18    LVEF=65%  ??? Mild Concentric LVH  ??? Normal Chamber Dimensions  ??? Mild Aortic Root Dilatation  ??? Mild Aortic Valve Sclerosis  ??? Pacer Wire In Right Heart Chambers  ??? No Pericardial Effusion  ??? PASP=83mmHg  ??? 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       Electrolyte problem      Cholecystitis      Pancreatitis (acute )      Neuro/Psych         Peripheral neuropathy:         Very lethargic      Musculoskeletal         Back pain      Arthritis      Endocrine/Other       Diabetes, well controlled, type 2      Anemia (Hb 9.5)      Malignancy (pancreatic cancer, opt for no treatment initially, now with pancreatic duct obstructiuon, may want treatment.)   Physical Exam    Airway Findings      Mallampati: II      TM distance: >3 FB      Neck ROM: full      Mouth opening: good      Airway patency: adequate    Dental Findings: Negative      Cardiovascular Findings:       Rhythm: regular      Rate: normal    Pulmonary Findings:       Breath sounds clear to auscultation.    Abdominal Findings: Negative      Neurological Findings:       Alert and oriented x 3       Diagnostic Tests  Hematology:   Lab Results   Component Value Date    HGB 8.4 06/11/2019 HCT 27.3 06/11/2019    PLTCT 96 06/11/2019    WBC 18.4 06/11/2019    NEUT 95 06/11/2019    ANC 17.54 06/11/2019    LYMPH 14 10/02/2018    ALC 0.46 06/11/2019    MONA 2 06/11/2019    AMC 0.39 06/11/2019    EOSA 0 06/11/2019    ABC 0.01 06/11/2019    BASOPHILS 1 10/01/2018    MCV 79.3 06/11/2019    MCH 24.5 06/11/2019    MCHC 30.9 06/11/2019    MPV 8.5 06/11/2019    RDW 20.6 06/11/2019         General Chemistry:   Lab Results   Component Value Date    NA 138 06/11/2019    K 5.0 06/11/2019    CL 111 06/11/2019    CO2 11 06/11/2019    GAP 16 06/11/2019    BUN 50 06/11/2019    CR 2.48 06/11/2019    GLU 178 06/11/2019    CA 7.8 06/11/2019    ALBUMIN 2.8 06/11/2019    LACTIC 5.2 06/11/2019    MG 1.2 06/11/2019    TOTBILI 4.3 06/11/2019    PO4 2.4 06/11/2019      Coagulation:   Lab Results   Component Value Date    PTT 32.4 10/01/2018    INR 1.0 10/01/2018         Anesthesia Plan    ASA score: 5   Plan: MAC  Induction method: intravenous  NPO status: acceptable      Informed Consent  Anesthetic plan and risks discussed with patient.  Plan discussed with: anesthesiologist and CRNA.    I have seen the patient and agree with the history, physical and anesthesia plan as above. Risks, benefits and alternatives of monitored anesthesia care including, but not limited to sore throat, oral/dental injury, allergic reactions, PONV, aspiration, respiratory failure, MI, CVA, or conversion to general anesthesia discussed with patient who reports understanding. Patient agrees to proceed with planned anesthetic after risks/benefits/alternatives explained.    Family meeting with palliative medicine documented in chart from earlier today. Pt has been thoroughly educated on likely risks with this procedure given his critical illness. We will proceed with MAC anesthetic, and abort if pt becomes hemodynamically unstable despite pressor support.    Charlies Silvers, MD, MS, CCC/SLP 06/11/2019 11:48 AM Assistant Professor, Anesthesiology and Critical Care  [phone] or available by [facility]

## 2019-06-11 NOTE — Progress Notes
Rapid response note    RR was called on this 73 yo male patient for low BP, fever and tachycardia    Patient seen and examined    Reported ad pain, exam significant for RUQ tenderness, no cp and sob     As per daughter at bedside he has been more lethargic    assessment plan    Septic shock due to cholangitis    Received 1 l NS still BP 60/40, 2nd liter running    Stat cbc, cmp and lactic acid ordered    Patient is already on adequate abx coverage, cefepim and flagyl    Patient will be transferred to icu for pressor    Freida Busman MD
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
## 2019-06-11 NOTE — Progress Notes
Medical Intensive Care Progress Note          Dustin Blackwell  Admission Date: 06/10/2019  LOS: 1 day  DNAR-Full Intervention                      ASSESSMENT/PLAN   Principal Problem:    Abdominal pain      Mr. 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 He was scheduled to undergo an ERCP with a biliary stent on 06/10/19, however, due to his low blood pressure the procedure was canceled. On the evening of 06/10/19, a rapid response was called for low blood pressure requiring pressors and he was subsequently transferred to the MICU.    Interval Hx (8/24): Continues to require norepinephrine and vasopressin for vasopressor support, although mildly improved w/ fluid resuscitation. Ongoing goals of care discussion determined patient is amenable to ERCP and risks involved.    Neuro/Psych:   # Acute metabolic encephalopathy  - A&O x 4, due to multiple factors including septic shock, worsening liver/renal function    Plan:   > CTM      Cardiac:   # Septic Shock  # Chronic Systolic Heart Failure  # HTN  -- Pt RR on 06/10/19 PM for hypotension requiring pressor support and subsequent transfer to ICU; given known likely biliary stent failure and RUQ pain, fever, and jaundice this is likely septic shock from acute cholangitis  - 10/2015 2D echo w/LVEF 20-25%, follows with [facility] cardiology  - 03/2018 2D echo with LVEF 65%  - PTA ASA 81mg , Coreg 25mg  BID, atorvastatin 40mg   - Pt received 3L of IVF during RR + 1 L NS in MICU  -- Medications:   -- Pressors: Phenyl (2); Norepinephrine (0.35); Vasopressin (0.2)   -- Treatment specific medications    Plan:   > Continue levo and vaso; wean as tolerated with goal MAP>65 > NiCOM IVF as tolerated, pt not sure if he wants CVC, will obtain PICC   > Hold PTA Coreg; hold atorvastatin   > OSH CT w/ mediastinal dz   > Repeat ECHO    Pulmonary:    # COPD  -- 8/24 CXR; Mostly unremarkable; trace increased interstitial markings  -- No PTA inhalers  -- SpO2 > 92% on room air    Plan:   >  CTM SpO2 w/ goal greater than 92%      GI:  # Acute cholangitis (rule out)  # Acute pancreatitis  # Elevated liver enzymes  -- Hx of bilary stent placement 2/2 pancreatic cancer  -- Presented to OSH w/ RUQ pain - OSH CT w/ pneumobilia concerning for stent obstruction  -- Lipase 345  -- T. 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  #  Metabolic Acidosis w/ Elevated Anion Gap  # Elevate Lactate  # Hypomagnesiemia  -- Lactate 5.8 < 5.2 < 3.9  -- Serum bicarb: 12  -- UOP: < 500 since admission  -- Baseline Cr: 1.5  -- BUN & Cr: 50 / 3.13  -- 4L IVF + fluids w/ meds (+7L)   -- K 5.2 w/ AM labs    Plan:   > Monitor UOP/Cr   > Fluid goal: Net even   > IVF hydration, replace lytes PRN   > Late AM labs follow K      ID:   # Elevated Procalcitonin  2] Acute Cholangitis  -- Procal > 100  -- Tmax 38.3   -- WBC: 18.4  -- Culture: Pending  -- UA: unremarkable  -- Antibiotics: Meropenem    Plan:   > Monitor Tmax & WBC   > Continue Meropenem    Heme/Onc:   # Stage 1A pancreatic cancer  -- Follows with Dr. Tamsen Meek at [facility]  -- Pt has elected to not undergo any chemotherapy or surgery, however, has also declined hospice  -- OSH CT scan prior to transfer reportedly showing a new mass in the pelvis concerning for metastasis, pneumobilia and mild ductal dilatation  -- 12/2018 CA 19-9 774 -- He is likely understaged at this time given his new scan, stent failure  -- Hgb: 8.4  -- Plt: 96    Plan:   > Monitor Hgb & Plt. Transfuse if hgb<7 or plt<20.   > DVT ppx: Heparin 5k SQ q8h      Endocrine:   1]# T2DM    -- Last A1c 10.4 on 05/2017  - PTA metformin 1000mg  BID, sitagliptin 100mg  daily, and empagliflozin 25mg  daily  -- Blood Glucose: 196  Plan:   > Mid dose correction factor   > Blood glucose goal 100-180mg /dl    FEN:   Guided by NiCOM / Replace PRN / Diet NPO at midnight      Prophylaxis Review:  Lines:  2 PIV; ART line (Placed 8/24); PICC triple lumen   Insulin: MDCF  Urinary Catheter: None  VTE ppx: SCDs; Heparin 5k SQ q8h  GI ppx: Pantoprazole  PT/OT: Ordered    Disposition:    Code status: DNAR-Full Intervention     Primary service: MICU1    Consults:  GI and Palliative Care    ___________________________________________________________________  SUBJECTIVE   Dustin Blackwell is a 73 y.o. male.  Patient reports some improvement in pain this AM. Feeling lightheaded but also improved. Ongoing discussion w/ daughter about goals of care. Currently wanting proceed w/ GI procedure.    ROS:   General: Lightheaded, some fatigue but improved  Cardio: No chest pain or syncope  Pulm: Denies cough or SOB  GI: RUQ pain, no emesis  GU: No dysuria    Medications:  Scheduled Meds:adenosine (ADENOCARD) syringe 12 mg, 12 mg, Intravenous, ONCE  adenosine (ADENOCARD) syringe 6 mg, 6 mg, Intravenous, ONCE  ADENOSINE 3 MG/ML IV SYRG (Cabinet Override), , , NOW  allopurinoL (ZYLOPRIM) tablet 100 mg, 100 mg, Oral, QDAY  aspirin EC tablet 81 mg, 81 mg, Oral, QDAY  atorvastatin (LIPITOR) tablet 40 mg, 40 mg, Oral, QHS  EPINEPHRINE HCL (PF) 1 MG/ML (1 ML) IJ SOLN (Cabinet Override), , , NOW  heparin (porcine) PF syringe 5,000 Units, 5,000 Units, Subcutaneous, Q8H  insulin aspart U-100 (NOVOLOG FLEXPEN) injection PEN 0-12 Units, 0-12 Units, Subcutaneous, ACHS (22)  LACTATED RINGERS IV SOLP (Cabinet Override), , , NOW  LACTATED RINGERS IV SOLP (Cabinet Override), , , NOW  meropenem (MERREM) IVP 500 mg, 500 mg, Intravenous, Q12H*  pantoprazole DR (PROTONIX) tablet 40 mg, 40 mg, Oral, QDAY  PHENYLEPHRINE HCL 10 MG/ML IJ SOLN (Cabinet Override), , , NOW  SODIUM CHLORIDE 0.9 % IV SOLP (Cabinet Override), , , NOW  sodium chloride PF 0.9% flush 5-10 mL, 5-10 mL, Flush, FLUSH TID    Continuous Infusions:  ??? norepinephrine (LEVOPHED)   4 mg/250 mL NS IV drip (std conc)(premade) 0.5 mcg/kg/min (06/11/19 0541)   ??? phenylephrine (NEO-SYNEPHRINE) 10 mg in sodium chloride 0.9% (NS) 250 mL IV drip (std conc) 3 mcg/kg/min (06/11/19 0541)   ??? sodium chloride 0.9 %   infusion 1,000 mL (06/10/19 2151)   ??? vasopressin (VASOSTRICT) 20 Units in sodium chloride 0.9% (NS) 100 mL IV drip (std conc) 1.8 Units/hr (06/11/19 0520)     PRN and Respiratory Meds:acetaminophen/lidocaine/antacid DS(#) Q3H PRN, fentaNYL citrate PF Q2H PRN, polyethylene glycol 3350 QDAY PRN       OBJECTIVE                     Vital Signs: Last Filed                  Vital Signs: 24 Hour Range   BP: 78/61 (08/24 0200)  ABP: 106/55 (08/24 0500)  Temp: 36.5 ???C (97.7 ???F) (08/24 0400)  Pulse: 117 (08/24 0500)  Respirations: 23 PER MINUTE (08/24 0500)  SpO2: 95 % (08/24 0500)  Height: 172.7 cm (67.99) (08/23 0800)  Weight: 67.6 kg (149 lb 0.5 oz) (08/23 0800)  Dosing / Dry Weight: 67.6 kg (149 lb 0.5 oz) (08/24 0100)  BP: (53-123)/(30-84)   ABP: (96-106)/(50-55)   Temp:  [36.5 ???C (97.7 ???F)-39.6 ???C (103.3 ???F)]   Pulse:  [74-130]   Respirations:  [12 PER MINUTE-29 PER MINUTE]   SpO2:  [90 %-97 %]     Intensity Pain Scale (Self Report): (not recorded) Vitals:    06/10/19 0800   Weight: 67.6 kg (149 lb 0.5 oz)              Intake/Output Summary:  (Last 24 hours)    Intake/Output Summary (Last 24 hours) at 06/11/2019 0604  Last data filed at 06/11/2019 0500  Gross per 24 hour   Intake 6279.25 ml   Output 350 ml   Net 5929.25 ml         Physical Exam: Blood pressure (!) 78/61, pulse 117, temperature 36.5 ???C (97.7 ???F), height 172.7 cm (67.99), weight 67.6 kg (149 lb 0.5 oz), SpO2 95 %.    General: No acute distress, thin, appears younger than stated age  HEENT: Minimal icterus, EOM intact, PERRL, clear oropharynx  Heart: Tachycardic, regular rhythm  Lungs: Clear to auscultation bilaterally,   Abdomen: Soft, normal bowel sounds  Extremities: No edema  Skin: no lesions/rashes    Point of Care Testing:  (Last 24 hours)  Glucose: (!) 184 (06/11/19 0140)  POC Glucose (Download): (!) 188 (06/10/19 2355)    Lab Review:    Recent Labs     06/10/19  0940 06/11/19  0140   NA 136* 136*   K 4.4 5.1   CL 103 107   CO2 16* 14*   BUN 37* 50*   CR 1.54* 3.13*   GLU 194* 184*   GAP 17* 15*   GFR 45* 20*   MG  --  1.2*   CA 9.9 8.3*   PO4  --  2.4     Recent Labs     06/10/19  0940 06/11/19  0140   ALKPHOS 827* 553*   AST 247* 225*   ALT 186* 178*   TOTPROT 9.2* 7.1   TOTBILI 4.3* 4.3*   ALBUMIN 4.1 3.1*     Recent Labs     06/10/19  0940 06/11/19  0140   HGB 10.5* 7.5*   HCT 33.5* 24.2*   WBC 9.9 18.6*   PLTCT 183 115*     No results for input(s): PHART, PCO2A, PO2ART, HCO3A, O2SATACAL in the last 72 hours.        24-hour labs:    Results for orders placed or performed during the hospital encounter of 06/10/19 (from the past 24 hour(s))   LACTIC ACID(LACTATE)    Collection Time: 06/10/19  3:43 PM   Result Value Ref Range    Lactic Acid 1.8 0.5 - 2.0 MMOL/L   POC GLUCOSE    Collection Time: 06/10/19  6:13 PM   Result Value Ref Range    Glucose, POC 186 (H) 70 - 100 MG/DL   POC GLUCOSE    Collection Time: 06/10/19 11:55 PM   Result Value Ref Range    Glucose, POC 188 (H) 70 - 100 MG/DL   CBC    Collection Time: 06/11/19  1:40 AM   Result Value Ref Range    White Blood Cells 18.6 (H) 4.5 - 11.0 K/UL    RBC 3.04 (L) 4.4 - 5.5 M/UL    Hemoglobin 7.5 (L) 13.5 - 16.5 GM/DL    Hematocrit 16.1 (L) 40 - 50 %    MCV 79.6 (L) 80 - 100 FL    MCH 24.7 (L) 26 - 34 PG MCHC 31.1 (L) 32.0 - 36.0 G/DL    RDW 09.6 (H) 11 - 15 %    Platelet Count 115 (L) 150 - 400 K/UL    MPV 8.2 7 - 11 FL   COMPREHENSIVE METABOLIC PANEL    Collection Time: 06/11/19  1:40 AM   Result Value Ref Range    Sodium 136 (L) 137 - 147 MMOL/L    Potassium 5.1 3.5 - 5.1 MMOL/L    Chloride 107 98 - 110 MMOL/L    Glucose 184 (H) 70 - 100 MG/DL    Blood Urea Nitrogen 50 (H) 7 - 25 MG/DL    Creatinine 0.45 (H) 0.4 - 1.24 MG/DL    Calcium 8.3 (L) 8.5 - 10.6 MG/DL    Total Protein 7.1 6.0 - 8.0 G/DL    Total Bilirubin 4.3 (H) 0.3 - 1.2 MG/DL    Albumin 3.1 (L) 3.5 - 5.0 G/DL    Alk Phosphatase 409 (H) 25 - 110 U/L    AST (SGOT) 225 (H) 7 - 40 U/L    CO2 14 (L) 21 - 30 MMOL/L    ALT (SGPT) 178 (H) 7 - 56 U/L    Anion Gap 15 (H) 3 - 12    eGFR Non African American 20 (L) >60 mL/min    eGFR African American 24 (L) >60 mL/min   LACTIC ACID (BG - RAPID LACTATE)    Collection Time: 06/11/19  1:40 AM   Result Value Ref Range    Lactic Acid,BG 3.9 (H) 0.5 - 2.0 MMOL/L   MAGNESIUM    Collection Time: 06/11/19  1:40 AM   Result Value Ref Range    Magnesium 1.2 (L) 1.6 - 2.6 mg/dL   PHOSPHORUS    Collection Time:  06/11/19  1:40 AM   Result Value Ref Range    Phosphorus 2.4 2.0 - 4.5 MG/DL   PROCALCITONIN    Collection Time: 06/11/19  1:40 AM   Result Value Ref Range    Procalcitonin >100.00 (H) <0.11 ng/mL   TROPONIN-I    Collection Time: 06/11/19  1:40 AM   Result Value Ref Range    Troponin-I 0.04 0.0 - 0.05 NG/ML   LACTIC ACID (BG - RAPID LACTATE)    Collection Time: 06/11/19  3:25 AM   Result Value Ref Range    Lactic Acid,BG 5.6 (HH) 0.5 - 2.0 MMOL/L   LACTIC ACID(LACTATE)    Collection Time: 06/11/19  3:25 AM   Result Value Ref Range    Lactic Acid 5.2 (HH) 0.5 - 2.0 MMOL/L   POC BLOOD GAS ARTERIAL    Collection Time: 06/11/19  5:58 AM   Result Value Ref Range    PH-ART-POC 7.38 7.35 - 7.45    PCO2-ART-POC 16 (LL) 35 - 45 MMHG    PO2-ART-POC 64 (L) 80 - 100 MMHG    Base Def-ART-POC 15.0 MMOL/L O2 Sat-ART-POC 93.0 (L) 95 - 99 %    Bicarbonate-ART-POC 9.7 (L) 21 - 28 MMOL/L   POC HEMATOCRIT    Collection Time: 06/11/19  5:58 AM   Result Value Ref Range    Hemoglobin POC 9.5 (L) 13.5 - 16.5 GM/DL    Hematocrit POC 04.5 (L) 40 - 50 %   POC POTASSIUM    Collection Time: 06/11/19  5:58 AM   Result Value Ref Range    Potassium-POC 5.4 (H) 3.5 - 5.1 MMOL/L   POC SODIUM    Collection Time: 06/11/19  5:58 AM   Result Value Ref Range    Sodium-POC 139 137 - 147 MMOL/L   POC IONIZED CALCIUM    Collection Time: 06/11/19  5:58 AM   Result Value Ref Range    Ionized Calcium-POC 1.15 1.0 - 1.3 MMOL/L   CBC AND DIFF    Collection Time: 06/11/19  6:00 AM   Result Value Ref Range    White Blood Cells 18.4 (H) 4.5 - 11.0 K/UL    RBC 3.44 (L) 4.4 - 5.5 M/UL    Hemoglobin 8.4 (L) 13.5 - 16.5 GM/DL    Hematocrit 40.9 (L) 40 - 50 %    MCV 79.3 (L) 80 - 100 FL    MCH 24.5 (L) 26 - 34 PG    MCHC 30.9 (L) 32.0 - 36.0 G/DL    RDW 81.1 (H) 11 - 15 %    Platelet Count 96 (L) 150 - 400 K/UL    MPV 8.5 7 - 11 FL    Neutrophils 95 (H) 41 - 77 %    Lymphocytes 3 (L) 24 - 44 %    Monocytes 2 (L) 4 - 12 %    Eosinophils 0 0 - 5 %    Basophils 0 0 - 2 %    Absolute Neutrophil Count 17.54 (H) 1.8 - 7.0 K/UL    Absolute Lymph Count 0.46 (L) 1.0 - 4.8 K/UL    Absolute Monocyte Count 0.39 0 - 0.80 K/UL    Absolute Eosinophil Count 0.03 0 - 0.45 K/UL    Absolute Basophil Count 0.01 0 - 0.20 K/UL   LACTIC ACID (BG - RAPID LACTATE)    Collection Time: 06/11/19  6:00 AM   Result Value Ref Range    Lactic Acid,BG 5.8 (HH) 0.5 - 2.0 MMOL/L  COMPREHENSIVE METABOLIC PANEL    Collection Time: 06/11/19  6:00 AM   Result Value Ref Range    Sodium 138 137 - 147 MMOL/L    Potassium 5.3 (H) 3.5 - 5.1 MMOL/L    Chloride 110 98 - 110 MMOL/L    Glucose 196 (H) 70 - 100 MG/DL    Blood Urea Nitrogen 48 (H) 7 - 25 MG/DL    Creatinine 4.09 (H) 0.4 - 1.24 MG/DL    Calcium 7.8 (L) 8.5 - 10.6 MG/DL    Total Protein 6.4 6.0 - 8.0 G/DL Total Bilirubin 4.3 (H) 0.3 - 1.2 MG/DL    Albumin 2.8 (L) 3.5 - 5.0 G/DL    Alk Phosphatase 811 (H) 25 - 110 U/L    AST (SGOT) 253 (H) 7 - 40 U/L    CO2 12 (L) 21 - 30 MMOL/L    ALT (SGPT) 185 (H) 7 - 56 U/L    Anion Gap 16 (H) 3 - 12    eGFR Non African American 23 (L) >60 mL/min    eGFR African American 28 (L) >60 mL/min   POC GLUCOSE    Collection Time: 06/11/19  6:26 AM   Result Value Ref Range    Glucose, POC 193 (H) 70 - 100 MG/DL   LACTIC ACID (BG - RAPID LACTATE)    Collection Time: 06/11/19  8:19 AM   Result Value Ref Range    Lactic Acid,BG 6.4 (HH) 0.5 - 2.0 MMOL/L   BASIC METABOLIC PANEL    Collection Time: 06/11/19 10:06 AM   Result Value Ref Range    Sodium 138 137 - 147 MMOL/L    Potassium 5.0 3.5 - 5.1 MMOL/L    Chloride 111 (H) 98 - 110 MMOL/L    CO2 11 (L) 21 - 30 MMOL/L    Anion Gap 16 (H) 3 - 12    Glucose 178 (H) 70 - 100 MG/DL    Blood Urea Nitrogen 50 (H) 7 - 25 MG/DL    Creatinine 9.14 (H) 0.4 - 1.24 MG/DL    Calcium 7.8 (L) 8.5 - 10.6 MG/DL    eGFR Non African American 26 (L) >60 mL/min    eGFR African American 31 (L) >60 mL/min   BLOOD GASES, MIXED VENOUS    Collection Time: 06/11/19 10:32 AM   Result Value Ref Range    pH-Mixed Venous 7.29 (L) 7.30 - 7.40    pCO2-Mixed Venous 27 (L) 40 - 45 MMHG    pO2-Mixed Venous 33 (L) >40 MMHG    Base Deficit-Mix Venous 12.5 MMOL/L    O2 Sat-MV (CALC) 50.0 %    Bicarbonate-MV-Cal 14.0 MMOL/L   POC GLUCOSE    Collection Time: 06/11/19 11:19 AM   Result Value Ref Range    Glucose, POC 160 (H) 70 - 100 MG/DL   URINALYSIS DIPSTICK REFLEX TO CULTURE    Collection Time: 06/11/19 11:21 AM    Specimen: Urine   Result Value Ref Range    Color,UA YELLOW     Turbidity,UA CLEAR CLEAR-CLEAR    Specific Gravity-Urine 1.018 1.003 - 1.035    pH,UA 5.0 5.0 - 8.0    Protein,UA 1+ (A) NEG-NEG    Glucose,UA 3+ (A) NEG-NEG    Ketones,UA TRACE (A) NEG-NEG    Bilirubin,UA NEG NEG-NEG    Blood,UA 3+ (A) NEG-NEG    Urobilinogen,UA NORMAL NORM-NORMAL Nitrite,UA NEG NEG-NEG    Leukocytes,UA NEG NEG-NEG    Urine Ascorbic Acid, UA NEG NEG-NEG  URINALYSIS MICROSCOPIC REFLEX TO CULTURE    Collection Time: 06/11/19 11:21 AM    Specimen: Urine   Result Value Ref Range    WBCs,UA 2-10 0 - 2 /HPF    RBCs,UA 2-10 0 - 3 /HPF    Comment,UA       Criteria for reflex to culture are WBC>10, Positive Nitrite, and/or >=+1   leukocytes. If quantity is not sufficient, an addendum will follow.      Squamous Epithelial Cells 0-2 0 - 5   CREATININE-URINE RANDOM    Collection Time: 06/11/19 11:21 AM   Result Value Ref Range    Creatinine, Random 75 MG/DL   SODIUM-URINE RANDOM    Collection Time: 06/11/19 11:21 AM   Result Value Ref Range    Sodium, Random 39 MMOL/L       Radiology and Other Diagnostic Procedures Review:   No results found.    Pertinent radiologic and diagnostic procedures reviewed.      Ventura Sellers, MD Date:  06/11/2019

## 2019-06-11 NOTE — Response Teams
Rapid Response Team Progress Note    Date: 06/11/2019 Time: 0033  Patient: Dustin Blackwell  Attending: Derrill Memo, MD Service: Med ICU 1 - 0406  Admission Date: 06/10/2019  LOS: 1 day    A Code/Rapid Response Timeline Event Report has been created for this patient on 8/24 at 0033. The patient was found to be hypotensive with SBP in the 60s. Patient awake and able to answer questions but lethargic. Primary team at bedside. Order to bolus 1012ml NS. Patient accepted by Dr. Dalbert Mayotte to ICU. Started on Levophed and transported with RRT. Daughter notified and at bedside.         Faustino Congress, RN
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
## 2019-06-11 NOTE — Case Management (ED)
Case Management Admission Assessment    NAME:Dustin Blackwell                          MRN: [id number]             DOB:1946/10/05          AGE: 73 y.o.  ADMISSION DATE: 06/10/2019             DAYS ADMITTED: LOS: 1 day      Today???s Date: 06/11/2019    Source of Information: pt's dtr Dustin Blackwell and EMR review (NCM called into pt's room, no answer)       This CM met with pt's dtr Dustin Blackwell for assessment on this date via phone r/t covid precautions.  Provided contact information and explanation of SW/NCM roles.  Reviewed [facility], Preparing for Discharge, and Preferred Provider Network hand-outs.  Provided opportunity for questions and discussion. Pt/family encouraged to contact Case Management team with questions and concerns during hospitalization and until patient is able to transition back to the patient's primary care physician.    Dustin Blackwell is a 73yo M who was admitted on 06/10/19 via transfer from OSH for worsening RUQ pain.     ??? Pt lives in 1 story home alone.  Dtr Dustin Blackwell or niece Dustin Blackwell able to provide transportation to home. It is unsure if pt can provide 24/7 support upon dc if needed. 3-4 stairs to enter home. Pt able to stay on main level of home.  ??? Pt was independent of cares prior to admission. Pt does not drive. Dtr Dustin Blackwell or niece Dustin Blackwell able to provide transportation to appointments and errands. Dustin Blackwell states pt also uses the bus system.  ??? Pt owns RW and has used [facility] in the recent past. Pt agreeable to using this agency again if needed. Pt has no hx of SNF/IPR/LTC/HD/LTACH.  ??? Pt fills meds at [facility] in [place]. Copays are affordable through pt's [place] Medicaid plan (copays generally less than $5.  ??? Pt plans to dc to home with intermittent family assistance pending medical progression/stability. Will continue to monitor.    Plan  Plan: Case Management Assessment, Assist PRN with SW/NCM Services  > Continue levo and vaso; wean as tolerated with goal MAP>65 > NiCOM IVF as tolerated, pt not sure if he wants CVC, will obtain PICC  > Hold PTA Coreg  > GI following, goals of care conversation with daughter and the patient on AM of 08/24 revealed that pt may not want to pursue stenting  > Continue ABx per ID section  > Escalate to meropenem  > Obtain blood cultures, UA, CXR; follow    Patient Address/Phone  [address]  [address]ust[place] [address]  [place] [id number]  [phone] (home)     Emergency Contact  Extended Emergency Contact Information  Primary Emergency Contact: Dustin Blackwell  [place] [address] [place] [place] [address] [place]  Home Phone: [phone]  Work Phone: [phone]  Mobile Phone: [phone]  Relation: Daughter  Secondary Emergency Contact: Dustin Blackwell  Address: [address] [place] [address] [place]  Home Phone: [phone]  Relation: Relative    Healthcare Directive  Healthcare Directive: Yes, patient has a healthcare directive  Type of Healthcare Directive: Durable power of attorney for healthcare  Location of Healthcare Directive: Copy in paper chart  Would patient like to fill out  a (a new) Editor, commissioning?: No, patient declined  Psych Advance Directive (Psych unit only): No, patient does not have a Social research officer, government  Does the patient need discharge transport arranged?: No  Transportation Name, Phone and Availability #1: dtr Dustin Blackwell Name, Phone and Availability #2: Dustin Blackwell  Does the patient use Medicaid Transportation?: No    Expected Discharge Date  Expected Discharge Date: 06/13/19  Discharge Planning Comments: dc planning ongoing    Living Situation Prior to Admission  ? Living Arrangements  Type of Residence: Home, independent  Living Arrangements: Alone  Financial risk analyst / Tub: Tub Only  How many levels in the residence?: 1  Can patient live on one level if needed?: Yes  Does residence have entry and/or side stairs?: Yes(3-4 steps to enter) Assistance needed prior to admit or anticipated on discharge: No  Who provides assistance or could if needed?: neice Dustin Blackwell or dtr [employer]  Are they in good health?: Yes  Can support system provide 24/7 care if needed?: Maybe  ? Level of Function      ? Cognitive Abilities   Cognitive Abilities: Alert and Oriented    Financial Resources  ? Coverage  Primary Insurance: Medicare  Secondary Insurance: Medicaid  Additional Coverage: RX    ? Source of Income   Source Of Income: Other retirement income, SSI  ? Financial Assistance Needed?  n/a    Psychosocial Needs  ? Mental Health  Mental Health History: No  ? Substance Use History  Substance Use History Screen: No  ? Other  n/a    Current/Previous Services  ? PCP  Dustin Blackwell, [phone], [phone]  ? Pharmacy    [facility] [address] [place] [address] [place] [address] [place] [address]  [place] [address]  Phone: [phone] Fax: [phone]    ? Durable Medical Equipment   Durable Medical Equipment at home: [employer]  ? Home Health  Receiving home health: In the past  Agency name: Dustin Blackwell [facility]  Would patient use this agency again?: Yes  ? Hemodialysis or Peritoneal Dialysis  Undergoing hemodialysis or peritoneal dialysis: No  ? Tube/Enteral Feeds     ? Infusion     ? Private Duty     ? Home and [employer] and community based services: No  ? Dustin Blackwell: N/A  ? Hospice  Hospice: No  ? Outpatient Therapy  PT: No  OT: No  SLP: No  ? Skilled Nursing Facility/Nursing Home  SNF: No  NH: No  ? Inpatient Rehab  IPR: No  ? Long-Term Acute Care Hospital  LTACH: No  ? Acute Hospital Stay  Acute Hospital Stay: In the past  Was patient's stay within the last 30 days?: No    Sela Hilding, BSN, RN  Department of Case Management  Pgr: [phone] ph: [phone]

## 2019-06-11 NOTE — Progress Notes
Patient arrived to room # [id number]) via bed accompanied by RN. Patient transferred to the bed with assistance. Bedside safety checks completed. Initial patient assessment completed. Refer to flowsheet for details.    Admission skin assessment completed with: Charm Barges, RN    Pressure injury present on arrival?: Yes    1. Head/Face/Neck: No  2. Trunk/Back: No  3. Upper Extremities: No  4. Lower Extremities: No  5. Pelvic/Coccyx: No  6. Assessed for device associated injury? Yes  7. Malnutrition Screening Tool (Nursing Nutrition Assessment) Completed? No    See Doc Flowsheet for additional wound details.     INTERVENTIONS:

## 2019-06-11 NOTE — Progress Notes
06/10/19 2014 06/10/19 2051 06/10/19 2052   Vitals   Temp 37.1 C (98.7 F) (!) 39.6 C (103.3 F)  (provider notified) (!) 38.1 C (100.6 F)  (provider notified)   Temperature Source Oral Axillary Oral   Pulse (!) 130  --   --    Respirations 24 PER MINUTE  --   --    SpO2 93 %  --   --    $$ O2 Delivery RA  --   --    BP 110/84  --   --    Mean NBP (Calculated) 93 MM HG  --   --      Dr. Loni Beckwith notified, order for bcxs, no changes to abxs, will not dose Tylenol d/t lab values, provider to d/c order; [place].

## 2019-06-11 NOTE — Progress Notes
06/11/19 0019 06/11/19 0020   Vitals   Temp 36.9 C (98.5 F) (!) 38.3 C (101 F)  (provider notified)   Temperature Source Oral Axillary   Pulse  --  120   Respirations  --  24 PER MINUTE   SpO2  --  94 %   $$ O2 Delivery  --  RA   BP  --  (!) 62/35  (provider notified)   Mean NBP (Calculated)  --  44 MM HG     Dr. Loni Beckwith notified, rapid response called.

## 2019-06-11 NOTE — Progress Notes
Pt taken to GI for ERCP at this time

## 2019-06-11 NOTE — Anesthesia Post-Procedure Evaluation
Post-Anesthesia Evaluation    Name: Dustin Blackwell      MRN: [id number]     DOB: 02/23/1946     Age: 73 y.o.     Sex: male   __________________________________________________________________________     Procedure Information     Anesthesia Start Date/Time:  06/11/19 1200    Procedure:  ENDOSCOPIC RETROGRADE CHOLANGIOPANCREATOGRAPHY WITH REMOVAL AND EXCHANGE OF STENT BILIARY/ PANCREATIC DUCT - EACH STENT EXCHANGED (N/A )    Location:  ENDO 2 / ENDO/GI    Surgeon:  Gordy Savers, MD          Post-Anesthesia Vitals    No vitals data found for the desired time range.        Post Anesthesia Evaluation Note    Evaluation location: ICU  Patient participation: recovered; patient participated in evaluation  Level of consciousness: sleepy but conscious    Pain score: 0  Pain management: adequate    Hydration: normovolemia  Temperature: 36.0C - 38.4C  Airway patency: adequate    Perioperative Events       Post-op nausea and vomiting: no PONV    Postoperative Status  Cardiovascular status: hemodynamically unstable  Respiratory status: supplemental oxygen and spontaneous ventilation  Additional comments: ABP 88/55 HR 105 SpO2 100%, pt drowsy but arousable  ICU Information  VasoactiveDrips:norepinephrine infusion, phenylephrine infusion and vasopressin  Blood Products Given-no                Staff involved in transport include: anesthesiologist, CRNA and OR nurse      Perioperative Events  Perioperative Event: No  Emergency Case Activation: No

## 2019-06-11 NOTE — Procedures
PICC Line Insertion Procedure Note    NAME:Dustin Blackwell                                             MRN: [id number]                 DOB:1946/10/10          AGE: 73 y.o.  ADMISSION DATE: 06/10/2019             DAYS ADMITTED: LOS: 1 day      Procedure Details: Informed consent was obtained for the procedure.  Risks of infection, blood clot, and nerve or vessel damage were discussed.     Indications: Intravenous medications requiring CV access.     Procedure: Under sterile conditions the skin at the insertion site was prepped with chlorhexadineand covered with a sterile drape. Local anesthesia was applied to the skin and subcutaneous tissues.  A #5 FR, Triple, PICC was inserted in theRight Basilic vein per hospital protocol. Blood return:  Yes Catheter trimmed, inserted to 43 cm, with 1 cm external.  Catheter was flushed with 30 mL NS. Patient did tolerate procedure well. Mid upper arm circumference is 28 cm.    Verification:Tip SVC per [employer] at bedside.

## 2019-06-12 ENCOUNTER — Encounter: Admit: 2019-06-12 | Discharge: 2019-06-12

## 2019-06-12 DIAGNOSIS — I509 Heart failure, unspecified: Secondary | ICD-10-CM

## 2019-06-12 DIAGNOSIS — I10 Essential (primary) hypertension: Secondary | ICD-10-CM

## 2019-06-12 DIAGNOSIS — E119 Type 2 diabetes mellitus without complications: Secondary | ICD-10-CM

## 2019-06-12 DIAGNOSIS — M1990 Unspecified osteoarthritis, unspecified site: Secondary | ICD-10-CM

## 2019-06-12 DIAGNOSIS — N400 Enlarged prostate without lower urinary tract symptoms: Secondary | ICD-10-CM

## 2019-06-12 DIAGNOSIS — G4733 Obstructive sleep apnea (adult) (pediatric): Secondary | ICD-10-CM

## 2019-06-12 DIAGNOSIS — J449 Chronic obstructive pulmonary disease, unspecified: Secondary | ICD-10-CM

## 2019-06-12 DIAGNOSIS — M549 Dorsalgia, unspecified: Secondary | ICD-10-CM

## 2019-06-12 DIAGNOSIS — K219 Gastro-esophageal reflux disease without esophagitis: Secondary | ICD-10-CM

## 2019-06-12 DIAGNOSIS — C259 Malignant neoplasm of pancreas, unspecified: Secondary | ICD-10-CM

## 2019-06-12 LAB — COMPREHENSIVE METABOLIC PANEL
Lab: 134 MMOL/L — ABNORMAL LOW (ref >60)
Lab: 4.4 MMOL/L — ABNORMAL LOW (ref >60)

## 2019-06-12 LAB — BLOOD GASES, ARTERIAL
Lab: 12 MMOL/L — ABNORMAL LOW (ref 21–28)
Lab: 15 MMOL/L
Lab: 17 mmHg — CL (ref 35–45)
Lab: 86 mmHg (ref 80–100)
Lab: 94 % — ABNORMAL LOW (ref 95–99)
Lab: 7.3 — ABNORMAL LOW (ref 7.35–7.45)

## 2019-06-12 LAB — LACTIC ACID (BG - RAPID LACTATE)
Lab: 6.5 MMOL/L — ABNORMAL HIGH (ref 0.5–2.0)
Lab: 5 MMOL/L — ABNORMAL HIGH (ref 0.5–2.0)
Lab: 3.3 MMOL/L — ABNORMAL HIGH (ref 0.5–2.0)
Lab: 2.6 MMOL/L — ABNORMAL HIGH (ref 0.5–2.0)

## 2019-06-12 LAB — BNP (B-TYPE NATRIURETIC PEPTI)
Lab: 117 pg/mL — ABNORMAL HIGH (ref 0–100)
Lab: 123 pg/mL — ABNORMAL HIGH (ref 0–100)

## 2019-06-12 LAB — TROPONIN-I
Lab: 0.4 ng/mL — ABNORMAL HIGH (ref 0.0–0.05)
Lab: 0.2 ng/mL — ABNORMAL HIGH (ref 0.0–0.05)
Lab: 0.3 ng/mL — ABNORMAL HIGH (ref 0.0–0.05)
Lab: 0.2 ng/mL — ABNORMAL HIGH (ref 0.0–0.05)

## 2019-06-12 LAB — POC GLUCOSE
Lab: 247 mg/dL — ABNORMAL HIGH (ref 70–100)
Lab: 236 mg/dL — ABNORMAL HIGH (ref 70–100)
Lab: 234 mg/dL — ABNORMAL HIGH (ref >60)
Lab: 215 mg/dL — ABNORMAL HIGH (ref 70–100)

## 2019-06-12 LAB — BASIC METABOLIC PANEL
Lab: 108 MMOL/L — ABNORMAL HIGH (ref 98–110)
Lab: 134 MMOL/L — ABNORMAL LOW (ref 137–147)
Lab: 2.1 mg/dL — ABNORMAL HIGH (ref 0.4–1.24)
Lab: 30 mL/min — ABNORMAL LOW (ref >60)
Lab: 4.7 MMOL/L — ABNORMAL HIGH (ref 3.5–5.1)
Lab: 52 mg/dL — ABNORMAL HIGH (ref 7–25)
Lab: 7.7 mg/dL — ABNORMAL LOW (ref 8.5–10.6)

## 2019-06-12 LAB — PHOSPHORUS: Lab: 3.6 mg/dL — ABNORMAL HIGH (ref >60)

## 2019-06-12 LAB — CBC AND DIFF
Lab: 24 K/UL — ABNORMAL HIGH (ref 4.5–11.0)
Lab: 3.6 M/UL — ABNORMAL LOW (ref 4.4–5.5)

## 2019-06-12 LAB — HEMOGLOBIN A1C: Lab: 6.9 % — ABNORMAL HIGH (ref 4.0–6.0)

## 2019-06-12 LAB — MAGNESIUM: Lab: 1.6 mg/dL — ABNORMAL HIGH (ref >60)

## 2019-06-12 MED ORDER — NPH INSULIN HUMAN RECOMB 100 UNIT/ML (3 ML) SC PEN
5 [IU] | Freq: Two times a day (BID) | SUBCUTANEOUS | 0 refills | Status: DC
Start: 2019-06-12 — End: 2019-06-13
  Administered 2019-06-12: 19:00:00 5 [IU] via SUBCUTANEOUS

## 2019-06-12 MED ORDER — MEROPENEM 1G/100ML NS IVPB (MB+)
1 g | Freq: Two times a day (BID) | INTRAVENOUS | 0 refills | Status: DC
Start: 2019-06-12 — End: 2019-06-18
  Administered 2019-06-12 – 2019-06-18 (×24): 1 g via INTRAVENOUS

## 2019-06-12 MED ORDER — MAGNESIUM SULFATE IN D5W 1 GRAM/100 ML IV PGBK
1 g | Freq: Once | INTRAVENOUS | 0 refills | Status: CP
Start: 2019-06-12 — End: ?
  Administered 2019-06-12: 15:00:00 1 g via INTRAVENOUS

## 2019-06-12 MED ADMIN — SODIUM CHLORIDE 0.9 % IV SOLP [27838]: 250 mL | INTRAVENOUS | @ 15:00:00 | Stop: 2019-06-12 | NDC 00338004902

## 2019-06-12 NOTE — Progress Notes
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 He was scheduled to undergo an ERCP with a biliary stent on 06/10/19, however, due to his low blood pressure the procedure was canceled. On the evening of 06/10/19, a rapid response was called for low blood pressure requiring pressors and he was subsequently transferred to the MICU.    Interval Hx (8/25): Febrile in PM, G+ resembling strep in blood cx (started linezolid), down to norepinephrine (0.3) and vasopressin (0.2). Will wean hydrocortisone when norepinephrine off. Troponin peaked at 0.49 and downtrend to 0.36 this AM. Concerning small urine output overnight (300 mL) after receiving nearly 10L IVF. New cough this AM.    Neuro/Psych:   # Acute metabolic encephalopathy  - A&O x 4, due to multiple factors including septic shock, worsening liver/renal function    Plan:   > CTM      Cardiac:   # Septic Shock  # Chronic Systolic Heart Failure  # HTN  -- Pt RR on 06/10/19 PM for hypotension requiring pressor support and subsequent transfer to ICU; given known likely biliary stent failure and RUQ pain, fever, and jaundice this is likely septic shock from acute cholangitis  -- Dual chamber ICD in place -   - 10/2015 2D echo w/LVEF 20-25%, follows with [facility] cardiology - 03/2018 2D echo with LVEF 65%  - PTA ASA 81mg , Coreg 25mg  BID, atorvastatin 40mg   - Pt received 3L of IVF during RR + 1 L NS in MICU  -- Troponin - 0.49 > 0.36  -- 06/11/19 ECHO: EF ~25%  -- Medications:   -- Pressors: Norepinephrine (0.35); Vasopressin (0.2)   -- Hydrocortisone 50 mg Q6    Plan:   > Continue norepinephrine and vaso; wean as tolerated with goal MAP>65   > Continue Hydrocortisone 50 mg Q6 until pressors off then taper   > NiCOM IVF judiciously   > Hold PTA Coreg; hold atorvastatin   > Continue PTA ASA 81mg    > OSH CT w/ mediastinal dz    Pulmonary:    # COPD  -- 8/24 CXR; Mostly unremarkable; trace increased interstitial markings  -- No PTA inhalers  -- SpO2 > 92% 2L o/n    Plan:   >  CTM SpO2 w/ goal greater than 92%      GI:  # Acute cholangitis (rule out)  # Acute pancreatitis  # Elevated liver enzymes  -- Hx of bilary stent placement 2/2 pancreatic cancer  -- Presented to OSH w/ RUQ pain - OSH CT w/ pneumobilia concerning for stent obstruction  -- New stent placed per GI on 8/24   -- Lipase 345  --  T. 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  # Metabolic Acidosis w/ Elevated Anion Gap  # Elevate Lactate  # Hypomagnesiemia  -- Lactate 3.3 < from peak of ~8  -- Serum bicarb: 12  -- UOP: 925 o/n  -- Baseline Cr: 1.5  -- BUN & Cr: 50 / 2.15 from peak of 3.13  -- 4L IVF + fluids w/ meds (+7L)   -- K 5.2 w/ AM labs    Plan:   > Monitor UOP/Cr   > Fluid goal: Net even   > IVF hydration, replace lytes PRN      ID:   # Elevated Procalcitonin  # Acute Cholangitis  -- See GI above  -- Procal > 100  -- Tmax 38.3   -- WBC: Increased to 24.0 this AM  < 5.5 < 18.4   -- UA: unremarkable  -- Antibiotics: Meropenem + Linezolid  -- Blood cx: G+ cocci > Follow speciation and 2nd blood cultures    Plan:   > Monitor Tmax & WBC > Continue Meropenem and linezolid until culture speciation    Heme/Onc:   # Stage 1A pancreatic cancer  -- Follows with Dr. Tamsen Meek at [facility]  -- Pt has elected to not undergo any chemotherapy or surgery, however, has also declined hospice  -- OSH CT scan prior to transfer reportedly showing a new mass in the pelvis concerning for metastasis, pneumobilia and mild ductal dilatation  -- 12/2018 CA 19-9 774  -- He is likely understaged at this time given his new scan, stent failure  -- Hgb: 8.4  -- Plt: 96    Plan:   > Monitor Hgb & Plt. Transfuse if hgb<7 or plt<20.   > DVT ppx: Heparin 5k SQ q8h      Endocrine:   # T2DM  -- Last A1c 10.4 on 05/2017  - PTA metformin 1000mg  BID, sitagliptin 100 mg daily, and empagliflozin 25 mg daily  -- Blood Glucose: 196  Plan:   > Regular insulin + Mid dose correction factor    > Consider long acting today > NPH 5 BID   > Blood glucose goal 100-180mg /dl    FEN:   Guided by NiCOM / Replace PRN / Diet NPO at midnight      Prophylaxis Review:  Lines:  2 PIV; ART line (Placed 8/24); PICC triple lumen   Insulin: MDCF  Urinary Catheter: None  VTE ppx: SCDs; Heparin 5k SQ q8h  GI ppx: Pantoprazole  PT/OT: Ordered    Disposition:    Code status: DNAR-Full Intervention     Primary service: MICU1    Consults:  GI and Palliative Care    ___________________________________________________________________  SUBJECTIVE   Dustin Blackwell is a 73 y.o. male.  Complaining of cough this morning. Otherwise, reports significant improvement in symptoms.    ROS:   General: Feels improved, somewhat fatigued  Cardio: No chest pain or syncope   Pulm: Reports cough, no SOB  GI: Denies abdominal pain  GU: Reports increase in urine output this AM.    Medications:  Scheduled Meds:allopurinoL (ZYLOPRIM) tablet 100 mg, 100 mg, Oral, QDAY  aspirin EC tablet 81 mg, 81 mg, Oral,  QDAY  heparin (porcine) PF syringe 5,000 Units, 5,000 Units, Subcutaneous, Q8H hydrocortisone PF (Solu-CORTEF) injection 50 mg, 50 mg, Intravenous, Q6H  insulin aspart U-100 (NOVOLOG FLEXPEN) injection PEN 0-12 Units, 0-12 Units, Subcutaneous, ACHS (22)  linezolid  (ZYVOX)  600 mg/D5W 300 mL IVPB, 600 mg, Intravenous, Q12H*  meropenem (MERREM) IVP 500 mg, 500 mg, Intravenous, Q12H*  pantoprazole DR (PROTONIX) tablet 40 mg, 40 mg, Oral, QDAY  sodium chloride PF 0.9% flush 5-10 mL, 5-10 mL, Flush, FLUSH TID    Continuous Infusions:  ??? EPINEPHrine (ADRENALIN) 4 mg in dextrose 5% (D5W) 250 mL IV drip (std conc) Stopped (06/11/19 2215)   ??? norepinephrine (LEVOPHED) 16 mg in dextrose 5% (D5W) 250 mL IV drip (quad conc) 0.3 mcg/kg/min (06/12/19 0546)   ??? phenylephrine (NEO-SYNEPHRINE) 80 mg in sodium chloride 0.9% (NS) 500 mL IV drip (quad conc) Stopped (06/11/19 1902)   ??? sodium chloride 0.9 %   infusion 1,000 mL (06/10/19 2151)   ??? vasopressin (VASOSTRICT) 20 Units in sodium chloride 0.9% (NS) 100 mL IV drip (std conc) 2.4 Units/hr (06/12/19 0328)     PRN and Respiratory Meds:acetaminophen/lidocaine/antacid DS(#) Q3H PRN, fentaNYL citrate PF Q2H PRN, polyethylene glycol 3350 QDAY PRN       OBJECTIVE                     Vital Signs: Last Filed                  Vital Signs: 24 Hour Range   BP: 103/76 (08/25 0200)  ABP: 96/54 (08/25 0200)  Temp: 37.7 ???C (99.8 ???F) (08/24 2000)  Pulse: 99 (08/25 0200)  Respirations: 22 PER MINUTE (08/25 0200)  SpO2: 99 % (08/25 0200)  Height: 170.2 cm (67) (08/24 1524)  Weight: 67.6 kg (149 lb) (08/24 1524)  BP: (95-112)/(70-90)   ABP: (58-132)/(36-76)   Temp:  [36.4 ???C (97.5 ???F)-38.1 ???C (100.6 ???F)]   Pulse:  [97-125]   Respirations:  [19 PER MINUTE-37 PER MINUTE]   SpO2:  [94 %-99 %]     Intensity Pain Scale (Self Report): (not recorded) Vitals:    06/10/19 0800 06/11/19 1524   Weight: 67.6 kg (149 lb 0.5 oz) 67.6 kg (149 lb)              Intake/Output Summary:  (Last 24 hours)    Intake/Output Summary (Last 24 hours) at 06/12/2019 0603 Last data filed at 06/11/2019 2100  Gross per 24 hour   Intake 4970.84 ml   Output 925 ml   Net 4045.84 ml         Physical Exam:    Blood pressure 103/76, pulse 99, temperature 37.7 ???C (99.8 ???F), height 170.2 cm (67), weight 67.6 kg (149 lb), SpO2 99 %.    General: No acute distress, and appears younger than stated age no acute distress, thin, appears younger than stated age  HEENT: Icterus resolved, EOM intact, PERRL   Heart: Regular rate and rhythm  Lungs: Clear to auscultation bilaterally   Abdomen: Soft, normal bowel sounds, nontender  Extremities: No edema   Skin: No lesions/rashes     Point of Care Testing:  (Last 24 hours)  Glucose: (!) 246 (06/12/19 0335)  POC Glucose (Download): (!) 247 (06/11/19 2246)    Lab Review:    Recent Labs     06/11/19  0140  06/11/19  1338 06/11/19  1630 06/11/19  2320 06/12/19  0335   NA 136*   < > 138 137  134* 134*   K 5.1   < > 5.9* 4.4 4.7 4.4   CL 107   < > 110 111* 108 108   CO2 14*   < > 10* 9* 11* 12*   BUN 50*   < > 51* 51* 52* 50*   CR 3.13*   < > 2.41* 2.39* 2.17* 2.15*   GLU 184*   < > 167* 236* 250* 246*   GAP 15*   < > 18* 17* 15* 14*   GFR 20*   < > 27* 27* 30* 30*   MG 1.2*  --   --   --   --  1.6   CA 8.3*   < > 7.8* 7.4* 7.7* 7.6*   PO4 2.4  --   --   --   --  3.6    < > = values in this interval not displayed.     Recent Labs     06/10/19  0940 06/11/19  0140 06/11/19  0600 06/12/19  0335   ALKPHOS 827* 553* 506* 436*   AST 247* 225* 253* 563*   ALT 186* 178* 185* 374*   TOTPROT 9.2* 7.1 6.4 6.3   TOTBILI 4.3* 4.3* 4.3* 3.7*   ALBUMIN 4.1 3.1* 2.8* 2.8*     Recent Labs     06/11/19  0140 06/11/19  0600 06/11/19  1338 06/12/19  0335   HGB 7.5* 8.4* 8.7* 9.0*   HCT 24.2* 27.3* 28.0* 28.6*   WBC 18.6* 18.4* 5.5 24.0*   PLTCT 115* 96* 92* 84*     Recent Labs     06/11/19  1630 06/11/19  2036   PHART 7.31* 7.34*   PCO2A 20* 17*   PO2ART 94 86   HCO3A 12.6* 12.7*   O2SATACAL 95.8 94.8*           24-hour labs: Results for orders placed or performed during the hospital encounter of 06/10/19 (from the past 24 hour(s))   POC GLUCOSE    Collection Time: 06/11/19  6:26 AM   Result Value Ref Range    Glucose, POC 193 (H) 70 - 100 MG/DL   LACTIC ACID (BG - RAPID LACTATE)    Collection Time: 06/11/19  8:19 AM   Result Value Ref Range    Lactic Acid,BG 6.4 (HH) 0.5 - 2.0 MMOL/L   BASIC METABOLIC PANEL    Collection Time: 06/11/19 10:06 AM   Result Value Ref Range    Sodium 138 137 - 147 MMOL/L    Potassium 5.0 3.5 - 5.1 MMOL/L    Chloride 111 (H) 98 - 110 MMOL/L    CO2 11 (L) 21 - 30 MMOL/L    Anion Gap 16 (H) 3 - 12    Glucose 178 (H) 70 - 100 MG/DL    Blood Urea Nitrogen 50 (H) 7 - 25 MG/DL    Creatinine 1.61 (H) 0.4 - 1.24 MG/DL    Calcium 7.8 (L) 8.5 - 10.6 MG/DL    eGFR Non African American 26 (L) >60 mL/min    eGFR African American 31 (L) >60 mL/min   BLOOD GASES, MIXED VENOUS    Collection Time: 06/11/19 10:32 AM   Result Value Ref Range    pH-Mixed Venous 7.29 (L) 7.30 - 7.40    pCO2-Mixed Venous 27 (L) 40 - 45 MMHG    pO2-Mixed Venous 33 (L) >40 MMHG    Base Deficit-Mix Venous 12.5 MMOL/L    O2 Sat-MV (CALC) 50.0 %  Bicarbonate-MV-Cal 14.0 MMOL/L   POC GLUCOSE    Collection Time: 06/11/19 11:19 AM   Result Value Ref Range    Glucose, POC 160 (H) 70 - 100 MG/DL   URINALYSIS DIPSTICK REFLEX TO CULTURE    Collection Time: 06/11/19 11:21 AM    Specimen: Urine   Result Value Ref Range    Color,UA YELLOW     Turbidity,UA CLEAR CLEAR-CLEAR    Specific Gravity-Urine 1.018 1.003 - 1.035    pH,UA 5.0 5.0 - 8.0    Protein,UA 1+ (A) NEG-NEG    Glucose,UA 3+ (A) NEG-NEG    Ketones,UA TRACE (A) NEG-NEG    Bilirubin,UA NEG NEG-NEG    Blood,UA 3+ (A) NEG-NEG    Urobilinogen,UA NORMAL NORM-NORMAL    Nitrite,UA NEG NEG-NEG    Leukocytes,UA NEG NEG-NEG    Urine Ascorbic Acid, UA NEG NEG-NEG   URINALYSIS MICROSCOPIC REFLEX TO CULTURE    Collection Time: 06/11/19 11:21 AM    Specimen: Urine   Result Value Ref Range WBCs,UA 2-10 0 - 2 /HPF    RBCs,UA 2-10 0 - 3 /HPF    Comment,UA       Criteria for reflex to culture are WBC>10, Positive Nitrite, and/or >=+1   leukocytes. If quantity is not sufficient, an addendum will follow.      Squamous Epithelial Cells 0-2 0 - 5   UA REFLEX CULTURE LABEL    Collection Time: 06/11/19 11:21 AM    Specimen: Urine   Result Value Ref Range    UA Reflex Culture       Criteria for reflex to culture are WBC>10, Positive Nitrite, and/or >=+1   leukocytes. If quantity is not sufficient, an addendum will follow.     CREATININE-URINE RANDOM    Collection Time: 06/11/19 11:21 AM   Result Value Ref Range    Creatinine, Random 75 MG/DL   SODIUM-URINE RANDOM    Collection Time: 06/11/19 11:21 AM   Result Value Ref Range    Sodium, Random 39 MMOL/L   CBC    Collection Time: 06/11/19  1:38 PM   Result Value Ref Range    White Blood Cells 5.5 4.5 - 11.0 K/UL    RBC 3.46 (L) 4.4 - 5.5 M/UL    Hemoglobin 8.7 (L) 13.5 - 16.5 GM/DL    Hematocrit 16.1 (L) 40 - 50 %    MCV 80.8 80 - 100 FL    MCH 25.1 (L) 26 - 34 PG    MCHC 31.0 (L) 32.0 - 36.0 G/DL    RDW 09.6 (H) 11 - 15 %    Platelet Count 92 (L) 150 - 400 K/UL    MPV 8.5 7 - 11 FL   TROPONIN-I    Collection Time: 06/11/19  1:38 PM   Result Value Ref Range    Troponin-I 0.19 (H) 0.0 - 0.05 NG/ML   BASIC METABOLIC PANEL    Collection Time: 06/11/19  1:38 PM   Result Value Ref Range    Sodium 138 137 - 147 MMOL/L    Potassium 5.9 (H) 3.5 - 5.1 MMOL/L    Chloride 110 98 - 110 MMOL/L    CO2 10 (L) 21 - 30 MMOL/L    Anion Gap 18 (H) 3 - 12    Glucose 167 (H) 70 - 100 MG/DL    Blood Urea Nitrogen 51 (H) 7 - 25 MG/DL    Creatinine 0.45 (H) 0.4 - 1.24 MG/DL    Calcium 7.8 (L) 8.5 -  10.6 MG/DL    eGFR Non African American 27 (L) >60 mL/min    eGFR African American 32 (L) >60 mL/min   LACTIC ACID (BG - RAPID LACTATE)    Collection Time: 06/11/19  1:38 PM   Result Value Ref Range    Lactic Acid,BG 8.7 (HH) 0.5 - 2.0 MMOL/L   POC GLUCOSE Collection Time: 06/11/19  1:40 PM   Result Value Ref Range    Glucose, POC 163 (H) 70 - 100 MG/DL   POC BLOOD GAS ARTERIAL    Collection Time: 06/11/19  1:48 PM   Result Value Ref Range    PH-ART-POC 7.17 (LL) 7.35 - 7.45    PCO2-ART-POC 28 (L) 35 - 45 MMHG    PO2-ART-POC 83 80 - 100 MMHG    Base Def-ART-POC 18.0 MMOL/L    O2 Sat-ART-POC 93.0 (L) 95 - 99 %    Bicarbonate-ART-POC 10.4 (L) 21 - 28 MMOL/L   POC HEMATOCRIT    Collection Time: 06/11/19  1:48 PM   Result Value Ref Range    Hemoglobin POC 8.8 (L) 13.5 - 16.5 GM/DL    Hematocrit POC 45.4 (L) 40 - 50 %   POC POTASSIUM    Collection Time: 06/11/19  1:48 PM   Result Value Ref Range    Potassium-POC 5.5 (H) 3.5 - 5.1 MMOL/L   POC SODIUM    Collection Time: 06/11/19  1:48 PM   Result Value Ref Range    Sodium-POC 141 137 - 147 MMOL/L   POC IONIZED CALCIUM    Collection Time: 06/11/19  1:48 PM   Result Value Ref Range    Ionized Calcium-POC 1.20 1.0 - 1.3 MMOL/L   POC GLUCOSE    Collection Time: 06/11/19  1:48 PM   Result Value Ref Range    Glucose, POC 173 (H) 70 - 100 MG/DL   LACTIC ACID(LACTATE)    Collection Time: 06/11/19  4:30 PM   Result Value Ref Range    Lactic Acid 7.4 (HH) 0.5 - 2.0 MMOL/L   BLOOD GASES, ARTERIAL    Collection Time: 06/11/19  4:30 PM   Result Value Ref Range    pH-Arterial 7.31 (L) 7.35 - 7.45    pCO2-Arterial 20 (L) 35 - 45 MMHG    pO2-Arterial 94 80 - 100 MMHG    Base Deficit-Arterial 15.3 MMOL/L    O2 Sat-Arterial 95.8 95 - 99 %    Bicarbonate-ART-Cal 12.6 (L) 21 - 28 MMOL/L   BASIC METABOLIC PANEL    Collection Time: 06/11/19  4:30 PM   Result Value Ref Range    Sodium 137 137 - 147 MMOL/L    Potassium 4.4 3.5 - 5.1 MMOL/L    Chloride 111 (H) 98 - 110 MMOL/L    CO2 9 (LL) 21 - 30 MMOL/L    Anion Gap 17 (H) 3 - 12    Glucose 236 (H) 70 - 100 MG/DL    Blood Urea Nitrogen 51 (H) 7 - 25 MG/DL    Creatinine 0.98 (H) 0.4 - 1.24 MG/DL    Calcium 7.4 (L) 8.5 - 10.6 MG/DL    eGFR Non African American 27 (L) >60 mL/min eGFR African American 33 (L) >60 mL/min   POC GLUCOSE    Collection Time: 06/11/19  5:12 PM   Result Value Ref Range    Glucose, POC 212 (H) 70 - 100 MG/DL   CULTURE-BLOOD W/SENSITIVITY    Collection Time: 06/11/19  6:21 PM    Specimen: Blood  Result Value Ref Range    Battery Name BLOOD CULTURE     Specimen Description BLOOD  LEFT  WRIST       Special Requests NONE     Culture NO GROWTH 1 DAY     Report Status     CULTURE-BLOOD W/SENSITIVITY    Collection Time: 06/11/19  6:33 PM    Specimen: Blood   Result Value Ref Range    Battery Name BLOOD CULTURE     Specimen Description BLOOD  RIGHT  PICC LINE       Special Requests NONE     Culture NO GROWTH 1 DAY     Report Status     TROPONIN-I    Collection Time: 06/11/19  8:35 PM   Result Value Ref Range    Troponin-I 0.49 (H) 0.0 - 0.05 NG/ML   LACTIC ACID (BG - RAPID LACTATE)    Collection Time: 06/11/19  8:36 PM   Result Value Ref Range    Lactic Acid,BG 6.5 (HH) 0.5 - 2.0 MMOL/L   BLOOD GASES, ARTERIAL    Collection Time: 06/11/19  8:36 PM   Result Value Ref Range    pH-Arterial 7.34 (L) 7.35 - 7.45    pCO2-Arterial 17 (LL) 35 - 45 MMHG    pO2-Arterial 86 80 - 100 MMHG    Base Deficit-Arterial 15.2 MMOL/L    O2 Sat-Arterial 94.8 (L) 95 - 99 %    Bicarbonate-ART-Cal 12.7 (L) 21 - 28 MMOL/L   POC GLUCOSE    Collection Time: 06/11/19 10:46 PM   Result Value Ref Range    Glucose, POC 247 (H) 70 - 100 MG/DL   BASIC METABOLIC PANEL    Collection Time: 06/11/19 11:20 PM   Result Value Ref Range    Sodium 134 (L) 137 - 147 MMOL/L    Potassium 4.7 3.5 - 5.1 MMOL/L    Chloride 108 98 - 110 MMOL/L    CO2 11 (L) 21 - 30 MMOL/L    Anion Gap 15 (H) 3 - 12    Glucose 250 (H) 70 - 100 MG/DL    Blood Urea Nitrogen 52 (H) 7 - 25 MG/DL    Creatinine 1.61 (H) 0.4 - 1.24 MG/DL    Calcium 7.7 (L) 8.5 - 10.6 MG/DL    eGFR Non African American 30 (L) >60 mL/min    eGFR African American 36 (L) >60 mL/min   LACTIC ACID (BG - RAPID LACTATE)    Collection Time: 06/11/19 11:20 PM Result Value Ref Range    Lactic Acid,BG 5.0 (HH) 0.5 - 2.0 MMOL/L   CBC AND DIFF    Collection Time: 06/12/19  3:35 AM   Result Value Ref Range    White Blood Cells 24.0 (H) 4.5 - 11.0 K/UL    RBC 3.66 (L) 4.4 - 5.5 M/UL    Hemoglobin 9.0 (L) 13.5 - 16.5 GM/DL    Hematocrit 09.6 (L) 40 - 50 %    MCV 78.2 (L) 80 - 100 FL    MCH 24.5 (L) 26 - 34 PG    MCHC 31.3 (L) 32.0 - 36.0 G/DL    RDW 04.5 (H) 11 - 15 %    Platelet Count 84 (L) 150 - 400 K/UL    MPV 9.0 7 - 11 FL   COMPREHENSIVE METABOLIC PANEL    Collection Time: 06/12/19  3:35 AM   Result Value Ref Range    Sodium 134 (L) 137 - 147 MMOL/L  Potassium 4.4 3.5 - 5.1 MMOL/L    Chloride 108 98 - 110 MMOL/L    Glucose 246 (H) 70 - 100 MG/DL    Blood Urea Nitrogen 50 (H) 7 - 25 MG/DL    Creatinine 4.25 (H) 0.4 - 1.24 MG/DL    Calcium 7.6 (L) 8.5 - 10.6 MG/DL    Total Protein 6.3 6.0 - 8.0 G/DL    Total Bilirubin 3.7 (H) 0.3 - 1.2 MG/DL    Albumin 2.8 (L) 3.5 - 5.0 G/DL    Alk Phosphatase 956 (H) 25 - 110 U/L    AST (SGOT) 563 (H) 7 - 40 U/L    CO2 12 (L) 21 - 30 MMOL/L    ALT (SGPT) 374 (H) 7 - 56 U/L    Anion Gap 14 (H) 3 - 12    eGFR Non African American 30 (L) >60 mL/min    eGFR African American 37 (L) >60 mL/min   MAGNESIUM    Collection Time: 06/12/19  3:35 AM   Result Value Ref Range    Magnesium 1.6 1.6 - 2.6 mg/dL   PHOSPHORUS    Collection Time: 06/12/19  3:35 AM   Result Value Ref Range    Phosphorus 3.6 2.0 - 4.5 MG/DL   LACTIC ACID (BG - RAPID LACTATE)    Collection Time: 06/12/19  3:35 AM   Result Value Ref Range    Lactic Acid,BG 3.3 (H) 0.5 - 2.0 MMOL/L   TROPONIN-I    Collection Time: 06/12/19  3:35 AM   Result Value Ref Range    Troponin-I 0.36 (H) 0.0 - 0.05 NG/ML       Radiology and Other Diagnostic Procedures Review:   No results found.    Pertinent radiologic and diagnostic procedures reviewed.      Ventura Sellers, MD Date:  06/12/2019

## 2019-06-12 NOTE — Progress Notes
1900 ~ Report received from day shift RN. A/O x 4 at this time and following commands. No complaints of pain or needs expressed. Lines/alarms verified and bedside safety check completed.

## 2019-06-12 NOTE — Case Management (ED)
Case Management Progress Note    NAME:Dustin Blackwell                          MRN: [id number]              DOB:[birth year]          AGE: 73 y.o.  ADMISSION DATE: 06/10/2019             DAYS ADMITTED: LOS: 1 day      Todays Date: 06/11/2019    Plan    Palliative care following for [name] as condition evolves.  Pt critically ill on multiple pressors in ICU.    Interventions  ? Support       SW rounded via Zoom; introduced self and role to Pt.  Pt did not identify any SW specific needs at this time.  Pt was very pleasant in his conversation and were encouraged to reach out to SW with any needs.  Will follow.    ? Info or Referral      ? Discharge Planning       DC plan will be in line with GOC once such is determined.    ? Medication Needs             ? Financial      ? Legal      ? Other        Disposition  ? Expected Discharge Date    Expected Discharge Date: 06/15/19  Discharge Planning Comments: dc planning ongoing  ? Transportation   Does the patient need discharge transport arranged?: No  Transportation Name, Phone and Availability #1: dtr Veterinary surgeon Name, Phone and Availability #2: [facility]  Does the patient use Medicaid Transportation?: No  ? Next Level of Care (Acute Psych discharges only)      ? Discharge Disposition                                          Durable Medical Equipment      No service has been selected for the patient.      [place] Dest[facility]      No service has been selected for the patient.      [place]      No service has been selected for the patient.       Dialysis/Infusion      No service has been selected for the patient.        Thalia Bloodgood, [place], [place]

## 2019-06-12 NOTE — Anesthesia Pain Rounding
Anesthesia Follow-Up Evaluation: Post-Procedure Day One    Name: Dustin Blackwell     MRN: [id number]     DOB: [birth year]     Age: 73 y.o.     Sex: male   __________________________________________________________________________     Procedure Date: 06/11/2019   Procedure: Procedure(s):  ENDOSCOPIC RETROGRADE CHOLANGIOPANCREATOGRAPHY WITH REMOVAL AND EXCHANGE OF STENT BILIARY/ PANCREATIC DUCT - EACH STENT EXCHANGED    Physical Assessment  Height: 170.2 cm (67)  Weight: 67.6 kg (149 lb)    Vital Signs (Last Filed in 24 hours)  BP: 108/91 (08/25 0700)  Temp: 37.7 ???C (99.8 ???F) (08/25 0400)  Pulse: 97 (08/25 0700)  Respirations: 25 PER MINUTE (08/25 0700)  SpO2: 98 % (08/25 0700)  SpO2 Pulse: 97 (08/25 0700)  Height: 170.2 cm (67) (08/24 1524)    Patient History   Allergies  Allergies   Allergen Reactions   ??? Strawberry RASH and SHORTNESS OF BREATH   ??? Lisinopril CHILLS     no cough with losartan   ??? Penicillins RASH and SHORTNESS OF BREATH        Medications  Scheduled Meds:allopurinoL (ZYLOPRIM) tablet 100 mg, 100 mg, Oral, QDAY  aspirin EC tablet 81 mg, 81 mg, Oral, QDAY  heparin (porcine) PF syringe 5,000 Units, 5,000 Units, Subcutaneous, Q8H  hydrocortisone PF (Solu-CORTEF) injection 50 mg, 50 mg, Intravenous, Q6H  insulin aspart U-100 (NOVOLOG FLEXPEN) injection PEN 0-12 Units, 0-12 Units, Subcutaneous, ACHS (22)  linezolid  (ZYVOX)  600 mg/D5W 300 mL IVPB, 600 mg, Intravenous, Q12H*  meropenem (MERREM) IVP 500 mg, 500 mg, Intravenous, Q12H*  pantoprazole DR (PROTONIX) tablet 40 mg, 40 mg, Oral, QDAY  sodium chloride PF 0.9% flush 5-10 mL, 5-10 mL, Flush, FLUSH TID    Continuous Infusions:  ??? EPINEPHrine (ADRENALIN) 4 mg in dextrose 5% (D5W) 250 mL IV drip (std conc) Stopped (06/11/19 2215)   ??? norepinephrine (LEVOPHED) 16 mg in dextrose 5% (D5W) 250 mL IV drip (quad conc) 0.3 mcg/kg/min (06/12/19 0706)   ??? phenylephrine (NEO-SYNEPHRINE) 80 mg in sodium chloride 0.9% (NS) 500 mL IV drip (quad conc) Stopped (06/11/19 1902)   ??? sodium chloride 0.9 %   infusion 1,000 mL (06/10/19 2151)   ??? vasopressin (VASOSTRICT) 20 Units in sodium chloride 0.9% (NS) 100 mL IV drip (std conc) 2.4 Units/hr (06/12/19 0328)     PRN and Respiratory Meds:acetaminophen/lidocaine/antacid DS(#) Q3H PRN, fentaNYL citrate PF Q2H PRN, polyethylene glycol 3350 QDAY PRN      Diagnostic Tests  Hematology:   Lab Results   Component Value Date    HGB 9.0 06/12/2019    HCT 28.6 06/12/2019    PLTCT 84 06/12/2019    WBC 24.0 06/12/2019    NEUT 95 06/11/2019    ANC 23.52 06/12/2019    ANC 17.54 06/11/2019    LYMPH 2 06/12/2019    ALC 0.46 06/11/2019    MONA 2 06/11/2019    AMC 0.39 06/11/2019    EOSA 0 06/11/2019    ABC 0.01 06/11/2019    BASOPHILS 1 10/01/2018    MCV 78.2 06/12/2019    MCH 24.5 06/12/2019    MCHC 31.3 06/12/2019    MPV 9.0 06/12/2019    RDW 20.7 06/12/2019         General Chemistry:   Lab Results   Component Value Date    NA 134 06/12/2019    K 4.4 06/12/2019    CL 108 06/12/2019    CO2 12 06/12/2019    GAP  14 06/12/2019    BUN 50 06/12/2019    CR 2.15 06/12/2019    GLU 246 06/12/2019    CA 7.6 06/12/2019    ALBUMIN 2.8 06/12/2019    LACTIC 7.4 06/11/2019    MG 1.6 06/12/2019    TOTBILI 3.7 06/12/2019    PO4 3.6 06/12/2019      Coagulation:   Lab Results   Component Value Date    PTT 32.4 10/01/2018    INR 1.0 10/01/2018         Follow-Up Assessment  Patient location during evaluation: ICU      Anesthetic Complications:   Anesthetic complications: The patient did not experience any anesthestic complications.      Pain:  Score: 0    Management:adequate     Level of Consciousness: awake and alert   Hydration:acceptable     Airway Patency:  Respiratory Status: acceptable     Cardiovascular Status:Cardiovascular status: Stable on pressors.   Regional/Neuroaxial:

## 2019-06-12 NOTE — Progress Notes
0700-0800: Report received. Bedside safety check completed. Pt a/ox4. Pt on RA. Pt has no c/o pain. Pt on levophed and vasopressin. VSS. Assessment complete see ICU flow sheet.

## 2019-06-13 ENCOUNTER — Encounter: Admit: 2019-06-13 | Discharge: 2019-06-13

## 2019-06-13 ENCOUNTER — Encounter: Admit: 2019-06-13 | Discharge: 2019-06-13 | Payer: MEDICARE

## 2019-06-13 ENCOUNTER — Ambulatory Visit: Admit: 2019-06-13 | Discharge: 2019-06-13

## 2019-06-13 DIAGNOSIS — E119 Type 2 diabetes mellitus without complications: Secondary | ICD-10-CM

## 2019-06-13 DIAGNOSIS — K831 Obstruction of bile duct: Secondary | ICD-10-CM

## 2019-06-13 DIAGNOSIS — M1990 Unspecified osteoarthritis, unspecified site: Secondary | ICD-10-CM

## 2019-06-13 DIAGNOSIS — Z1159 Encounter for screening for other viral diseases: Secondary | ICD-10-CM

## 2019-06-13 DIAGNOSIS — C259 Malignant neoplasm of pancreas, unspecified: Secondary | ICD-10-CM

## 2019-06-13 DIAGNOSIS — I10 Essential (primary) hypertension: Secondary | ICD-10-CM

## 2019-06-13 DIAGNOSIS — J449 Chronic obstructive pulmonary disease, unspecified: Secondary | ICD-10-CM

## 2019-06-13 DIAGNOSIS — G4733 Obstructive sleep apnea (adult) (pediatric): Secondary | ICD-10-CM

## 2019-06-13 DIAGNOSIS — N400 Enlarged prostate without lower urinary tract symptoms: Secondary | ICD-10-CM

## 2019-06-13 DIAGNOSIS — I509 Heart failure, unspecified: Secondary | ICD-10-CM

## 2019-06-13 DIAGNOSIS — K219 Gastro-esophageal reflux disease without esophagitis: Secondary | ICD-10-CM

## 2019-06-13 DIAGNOSIS — M549 Dorsalgia, unspecified: Secondary | ICD-10-CM

## 2019-06-13 LAB — POC GLUCOSE
Lab: 273 mg/dL — ABNORMAL HIGH (ref 70–100)
Lab: 193 mg/dL — ABNORMAL HIGH (ref 70–100)
Lab: 194 mg/dL — ABNORMAL HIGH (ref 70–100)
Lab: 248 mg/dL — ABNORMAL HIGH (ref 70–100)
Lab: 233 mg/dL — ABNORMAL HIGH (ref 70–100)

## 2019-06-13 LAB — COMPREHENSIVE METABOLIC PANEL
Lab: 1.7 mg/dL — ABNORMAL HIGH (ref 0.4–1.24)
Lab: 136 MMOL/L — ABNORMAL LOW (ref 137–147)
Lab: 231 mg/dL — ABNORMAL HIGH (ref 70–100)

## 2019-06-13 LAB — CBC AND DIFF: Lab: 25 K/UL — ABNORMAL HIGH (ref 4.5–11.0)

## 2019-06-13 LAB — MAGNESIUM: Lab: 2.1 mg/dL — ABNORMAL LOW (ref >60)

## 2019-06-13 LAB — PHOSPHORUS: Lab: 1.8 mg/dL — ABNORMAL LOW (ref >60)

## 2019-06-13 MED ORDER — LACTATED RINGERS IV SOLP
1000 mL | INTRAVENOUS | 0 refills | Status: CN
Start: 2019-06-13 — End: ?

## 2019-06-13 MED ORDER — POTASSIUM CHLORIDE 20 MEQ PO TBTQ
40 meq | Freq: Once | ORAL | 0 refills | Status: CP
Start: 2019-06-13 — End: ?
  Administered 2019-06-13: 10:00:00 40 meq via ORAL

## 2019-06-13 MED ORDER — NPH INSULIN HUMAN RECOMB 100 UNIT/ML (3 ML) SC PEN
8 [IU] | Freq: Two times a day (BID) | SUBCUTANEOUS | 0 refills | Status: DC
Start: 2019-06-13 — End: 2019-06-19
  Administered 2019-06-14: 02:00:00 8 [IU] via SUBCUTANEOUS

## 2019-06-13 MED ORDER — HYDROCORTISONE SOD SUCC (PF) 100 MG/2 ML IJ SOLR
50 mg | Freq: Two times a day (BID) | INTRAVENOUS | 0 refills | Status: CP
Start: 2019-06-13 — End: ?
  Administered 2019-06-14 – 2019-06-17 (×7): 50 mg via INTRAVENOUS

## 2019-06-13 MED ORDER — POTASSIUM PHOSPHATE IVPB-STD
12 MMOL | Freq: Once | INTRAVENOUS | 0 refills | Status: CP
Start: 2019-06-13 — End: ?
  Administered 2019-06-13 (×2): 12 mmol via INTRAVENOUS

## 2019-06-13 MED ORDER — NPH INSULIN HUMAN RECOMB 100 UNIT/ML (3 ML) SC PEN
3 [IU] | Freq: Once | SUBCUTANEOUS | 0 refills | Status: CP
Start: 2019-06-13 — End: ?
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
## 2019-06-13 NOTE — Progress Notes
1900: Assumed care of patient. Bedside safety check completed with day shift RN. Levo gtt infusing. Daughter, Claiborne Billings, at bedside.    2000: Assessment completed as per doc flowsheets. VSS per patient trends.

## 2019-06-13 NOTE — Progress Notes
OCCUPATIONAL THERAPY  NOTE       Name: Dustin Blackwell        MRN: [id number]          DOB: 29-May-1946          Age: 73 y.o.  Admission Date: 06/10/2019             LOS: 3 days        Patient unavailable due to being seen by RN at bedside. Will continue to follow and provide intervention as appropriate.        Therapist: Lorrene Reid, OT/L [phone]  Date: 06/13/2019

## 2019-06-13 NOTE — Progress Notes
0700-0800: Report received. Bedside safety check completed. Pt a/ox4. Pt on RA. Pt on levo, VSS. Assessment complete see ICU flow sheet.

## 2019-06-13 NOTE — Progress Notes
PHYSICAL THERAPY  ASSESSMENT      Name: Dustin Blackwell        MRN: [id number]          DOB: [birth year]          Age: 73 y.o.  Admission Date: 06/10/2019             LOS: 3 days      Mobility  Patient Turn/Position: Chair  Progressive Mobility Level: Walk in room  Distance Walked (feet): 30 ft  Level of Assistance: Assist X1  Assistive Device: None  Time Tolerated: 11-30 minutes  Activity Limited By: Change in vital signs;Fatigue    Subjective  Significant hospital events: PMH of systolic heart failure s/p dual-chamber ICD with recovered EF, COPD, T2DM, hypertension, OSA, former smoker, stage Ia pancreatic adenocarcinoma not on any treatment, ERCP s/p biliary drain placement, arthritis and chronic back pain who was admitted on 06/10/19 via transfer from OSH for worsening RUQ pain. He was scheduled to undergo an ERCP with a biliary stent on 06/10/19, however, due to his low blood pressure the procedure was canceled. On the evening of 06/10/19, a rapid response was called for low blood pressure requiring pressors and he was subsequently transferred to the MICU.  Mental / Cognitive Status: Alert;Oriented;Cooperative;Follows Commands  Persons Present: (Cousin -- steps out during therapy session)  Pain: Patient has no complaint of pain  Pain Interventions: Patient agrees to participate in therapy;Patient assisted into position of comfort  Comments: Room Air; 0.02 levo, MAP goal >60, R radial arterial line.     Ambulation Assist: Independent Mobility in Community without Device  Patient Owned Equipment: Water engineer doesn't know where it is )  Home Situation: Lives Alone  Type of Home: House  Entry Stairs: 3-5 Stairs;Rail on 1 Side  In-Home Stairs: No Stairs  Comments: Patient reports 1 recent fall PTA. He states that he typically wakes up around 4:30AM and goes fishing for an hour or so.     ROM  LE ROM: Bilateral;WFL    Strength  Overall Strength: Generalized Weakness    Bed Mobility/Transfer Bed Mobility: Supine to Sit: Standby Assist  Comments: Patient reclined in bedside chair at end of therapy session. TABS alarm activated, call light within reach and RN notified.     Transfer Type: Sit to/from Stand  Transfer: Assistance Level: From;Bed;Minimal Assist  Transfer: Assistive Device: None  Transfers: Type Of Assistance: Verbal Cues;For Balance;For Strength Deficit;For Safety Considerations    End Of Activity Status: Up in Chair;Nursing Notified;Instructed Patient to Request Assist with Mobility;Instructed Patient to Use Call Light    Gait  Gait Distance: 30 feet  Gait: Assistance Level: Minimal Assist;Management of Lines;Safety Considerations  Gait: Assistive Device: None  Gait: Descriptors: Pace: Futures trader;No balance loss;Decreased step length  Comments: MAP briefly in upper 50s after ambulation in room/transfer to chair. Leg rest elevated and patient quick to recover.     Activity Limited By: Lines/Leads    Education  Persons Educated: Patient  Patient Barriers To Learning: None Noted  Teaching Methods: Verbal Instruction  Patient Response: Verbalized Understanding  Topics: Plan/Goals of PT Interventions;Mobility Progression;Safety Awareness;Up with Assist Only;Importance of Increasing Activity;Recommend Continued Therapy;Therapy Schedule    Assessment/Progress  Assessment/Progress: Should Improve w/ Continued PT    AM-PAC 6 Clicks Basic Mobility Inpatient  Turning from your back to your side while in a flat bed without using bed rails: None  Moving from lying on your back to sitting on the side of a  flatbed without using bedrails : None  Moving to and from a bed to a chair (including a wheelchair): A Little  Standing up from a chair using your arms (e.g. wheelchair, or bedside chair): A Little  To walk in hospital room: A Little  Climbing 3-5 steps with a railing: A Lot  Raw Score: 19  Standardized (T-scale) Score: 42.48  Basic Mobility CMS 0-100%: 36.99 CMS G Code Modifier for Basic Mobility: CJ    Goals  Goal Formulation: With Patient  Time For Goal Achievement: 5 days  Patient Will Go Supine To/From Sit: w/ Stand By Assist  Patient Will Transfer Sit to Stand: w/ Stand By Assist  Patient Will Ambulate: 101-150 Feet, w/ No Device, w/ Stand By Assist  Patient Will Go Up / Down Stairs: 3-5 Stairs, w/ Stand By Assist    Plan  Treatment Interventions: Mobility Training  Plan Frequency: 5-7 Days per Week  PT Plan for Next Visit: Ambulate to tolerance.     PT Discharge Recommendations  Recommendation: Currently patient requires inpatient level of care. However, typical progression for patient condition would anticipate home with assistance at time of discharge. -- Anticipate patient will progress well with therapy, however he lives alone and will need to demonstrate independence with all mobility prior to discharge.     Therapist  Marcelle Overlie, PT, DPT [phone]  Date  06/13/2019

## 2019-06-13 NOTE — Progress Notes
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 He was scheduled to undergo an ERCP with a biliary stent on 06/10/19, however, due to his low blood pressure the procedure was canceled. On the evening of 06/10/19, a rapid response was called for low blood pressure requiring pressors and he was subsequently transferred to the MICU.    Interval Hx (8/26): Cultures with speciation - enterococcus. Consulted ID. Pressors rapidly being weaned off, should be able to pull PICC and art line today pending. Decrease stress dose steroids to BID. Increase NPH to 8 BID.    Neuro/Psych:   # Acute metabolic encephalopathy  - A&O x 4, due to multiple factors including septic shock, worsening liver/renal function     Plan:   > CTM      Cardiac:   # Septic Shock  # Chronic Systolic Heart Failure  # HTN  -- Pt RR on 06/10/19 PM for hypotension requiring pressor support and subsequent transfer to ICU; given known likely biliary stent failure and RUQ pain, fever, and jaundice this is likely septic shock from acute cholangitis  -- Dual chamber ICD in place -   - 10/2015 2D echo w/LVEF 20-25%, follows with [facility] cardiology  - 03/2018 2D echo with LVEF 65%  - PTA ASA 81mg , Coreg 25mg  BID, atorvastatin 40mg   - Pt received 3L of IVF during RR + 1 L NS in MICU -- Troponin - 0.49 > 0.36  -- 06/11/19 ECHO: EF ~25%  -- Medications:   -- Pressors: Norepinephrine (0.3)   -- Hydrocortisone 50 mg Q6    Plan:   > Continue norepinephrine; wean as tolerated with goal MAP>65   > Start taper of hydrocortisone from q6hr to 50mg  BID -> plan for qday tommorow    > Hold PTA Coreg; hold atorvastatin   > Continue PTA ASA 81mg     Pulmonary:    # COPD  -- 8/24 CXR; Mostly unremarkable; trace increased interstitial markings  -- No PTA inhalers  -- SpO2 > 92% 2L o/n    Plan:   >  CTM SpO2 w/ goal greater than 92%      GI:  # Acute cholangitis (rule out)  # Acute pancreatitis  # Elevated liver enzymes  -- Hx of bilary stent placement 2/2 pancreatic cancer  -- Presented to OSH w/ RUQ pain - OSH CT w/ pneumobilia concerning for stent obstruction  -- New stent placed per GI on 8/24   -- Lipase 345  -- T. 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, improving  # Metabolic Acidosis w/ Elevated Anion Gap  # Elevate Lactate  # Hypomagnesiemia  -- Lactate 3.3 < from peak of ~8  -- Serum bicarb: 12  -- UOP: 925 o/n  -- Baseline Cr: 1.5  -- BUN & Cr: 50 / 2.15 from peak of 3.13  -- 4L IVF + fluids w/ meds (+7L)   -- K 5.2 w/ AM labs    Plan:   > Monitor UOP/Cr   > Fluid goal: Net even   > IVF hydration, replace lytes PRN      ID:   # Enterococcus bacteremia  # Elevated Procalcitonin  # Acute Cholangitis  -- See GI above  -- Procal > 100  -- Tmax 38.3   -- WBC: Increased to 24.0 this AM  < 5.5 < 18.4   -- UA: unremarkable  -- Antibiotics: Meropenem + Linezolid  -- Blood cx: G+ cocci > Follow speciation and 2nd blood cultures    Plan:   > ID consult, appreciate recommendations   > Continue Meropenem and linezolid    Heme/Onc:   # Stage 1A pancreatic cancer  -- Follows with Dr. Tamsen Meek at [facility] -- Pt has elected to not undergo any chemotherapy or surgery, however, has also declined hospice  -- OSH CT scan prior to transfer reportedly showing a new mass in the pelvis concerning for metastasis, pneumobilia and mild ductal dilatation  -- 12/2018 CA 19-9 774  -- He is likely understaged at this time given his new scan, stent failure  -- Hgb: 8.4  -- Plt: 96    Plan:   > Monitor Hgb & Plt. Transfuse if hgb<7 or plt<20.   > DVT ppx: Heparin 5k SQ q8h     > Could consider oncology consult once on the floor as he is now expressing a desire for possible treatment    Endocrine:   # T2DM  -- Last A1c 10.4 on 05/2017  - PTA metformin 1000mg  BID, sitagliptin 100 mg daily, and empagliflozin 25 mg daily  -- Blood Glucose: 196  Plan:   > Regular insulin + Mid dose correction factor    > Increase NPH to 8 BID   > Blood glucose goal 100-180mg /dl    FEN:   Guided by [facility] / Replace PRN / Regular diet      Prophylaxis Review:  Lines:  2 PIV; ART line (Placed 8/24); PICC triple lumen   Insulin: MDCF  Urinary Catheter: None  VTE ppx: SCDs; Heparin 5k SQ q8h  GI ppx: Pantoprazole  PT/OT: Ordered    Disposition: continue micu admission, anticipate possible floor status tommorow     Code status: DNAR-Full Intervention     Primary service: MICU1    Consults:  GI and Palliative Care    ___________________________________________________________________  SUBJECTIVE   Dustin Blackwell is a 73 y.o. male.  He is wanting to get up and walk around the unit with PT. Ate well last night. He denies other acute symptoms. No events overnight. Pressors continued to be weaned.    ROS:   General: Feels improved, somewhat fatigued  Cardio: No chest pain or syncope   Pulm: Reports cough, no SOB  GI: Denies abdominal pain  GU: Reports increase in urine output this AM.    Medications:  Scheduled Meds:allopurinoL (ZYLOPRIM) tablet 100 mg, 100 mg, Oral, QDAY  aspirin EC tablet 81 mg, 81 mg, Oral, QDAY heparin (porcine) PF syringe 5,000 Units, 5,000 Units, Subcutaneous, Q8H  hydrocortisone PF (Solu-CORTEF) injection 50 mg, 50 mg, Intravenous, Q6H  insulin aspart U-100 (NOVOLOG FLEXPEN) injection PEN 0-12 Units, 0-12 Units, Subcutaneous, ACHS (22)  insulin NPH (HUMULIN N KwikPen) injection PEN 5 Units, 5 Units, Subcutaneous, BID(8-21)  linezolid  (ZYVOX)  600 mg/D5W 300 mL IVPB, 600 mg, Intravenous, Q12H*  meropenem (MERREM) 1 g in sodium chloride 0.9% (NS) 100 mL IVPB (MB+), 1 g, Intravenous, Q12H*  pantoprazole DR (PROTONIX) tablet 40 mg, 40 mg, Oral, QDAY  sodium chloride PF 0.9% flush 5-10 mL, 5-10 mL, Flush, FLUSH TID    Continuous Infusions:  ??? EPINEPHrine (ADRENALIN) 4 mg in dextrose 5% (D5W) 250 mL IV drip (std conc) Stopped (06/11/19 2215)   ??? norepinephrine (LEVOPHED) 16 mg in dextrose 5% (D5W) 250 mL IV drip (quad conc) 0.08 mcg/kg/min (06/13/19 1610)   ??? phenylephrine (NEO-SYNEPHRINE) 80 mg in sodium chloride 0.9% (NS) 500 mL IV drip (quad conc) Stopped (06/11/19 1902)   ??? sodium chloride 0.9 %   infusion 1,000 mL (06/10/19 2151)   ??? vasopressin (VASOSTRICT) 20 Units in sodium chloride 0.9% (NS) 100 mL IV drip (std conc) Stopped (06/12/19 1700)     PRN and Respiratory Meds:acetaminophen/lidocaine/antacid DS(#) Q3H PRN, fentaNYL citrate PF Q2H PRN, polyethylene glycol 3350 QDAY PRN       OBJECTIVE                     Vital Signs: Last Filed                  Vital Signs: 24 Hour Range   BP: 108/80 (08/25 2200)  ABP: 111/55 (08/26 0600)  Temp: 37.2 ???C (99 ???F) (08/26 0400)  Pulse: 97 (08/26 0600)  Respirations: 20 PER MINUTE (08/26 0600)  SpO2: 97 % (08/26 0600)  BP: (102-123)/(70-92)   ABP: (96-119)/(51-63)   Temp:  [36.5 ???C (97.7 ???F)-37.2 ???C (99 ???F)]   Pulse:  [88-110]   Respirations:  [14 PER MINUTE-33 PER MINUTE]   SpO2:  [94 %-100 %]     Intensity Pain Scale (Self Report): (not recorded) Vitals:    06/10/19 0800 06/11/19 1524   Weight: 67.6 kg (149 lb 0.5 oz) 67.6 kg (149 lb) Intake/Output Summary:  (Last 24 hours)    Intake/Output Summary (Last 24 hours) at 06/13/2019 0658  Last data filed at 06/13/2019 0600  Gross per 24 hour   Intake 2809.59 ml   Output 1720 ml   Net 1089.59 ml         Physical Exam:    Blood pressure 108/80, pulse 97, temperature 37.2 ???C (99 ???F), height 170.2 cm (67), weight 67.6 kg (149 lb), SpO2 97 %.    General: No acute distress, and appears younger than stated age no acute distress, thin, appears younger than stated age  HEENT: Icterus resolved, EOM intact, PERRL   Heart: Regular rate and rhythm  Lungs: Clear to auscultation bilaterally   Abdomen: Soft, normal bowel sounds, nontender  Extremities: No edema   Skin: No lesions/rashes     Point of Care Testing:  (Last 24 hours)  Glucose: (!) 231 (06/13/19 0327)  POC Glucose (Download): (!) 193 (06/13/19 0607)    Lab Review:    Recent Labs     06/11/19  0140  06/11/19  1630 06/11/19  2320 06/12/19  0335  06/13/19  0327   NA 136*   < > 137 134* 134* 136*   K 5.1   < > 4.4 4.7 4.4 3.6   CL 107   < > 111* 108 108 110   CO2 14*   < > 9* 11* 12* 16*   BUN 50*   < > 51* 52* 50* 43*   CR 3.13*   < > 2.39* 2.17* 2.15* 1.74*   GLU 184*   < > 236* 250* 246* 231*   GAP 15*   < > 17* 15* 14* 10   GFR 20*   < > 27* 30* 30* 39*   MG 1.2*  --   --   --  1.6 2.1   CA 8.3*   < > 7.4* 7.7* 7.6* 8.1*   PO4 2.4  --   --   --  3.6 1.8*    < > = values in this interval not displayed.     Recent Labs     06/11/19  0140 06/11/19  0600 06/12/19  0335 06/13/19  0327   ALKPHOS 553* 506* 436* 390*   AST 225* 253* 563* 732*   ALT 178* 185* 374* 619*   TOTPROT 7.1 6.4 6.3 6.5   TOTBILI 4.3* 4.3* 3.7* 2.3*   ALBUMIN 3.1* 2.8* 2.8* 2.8*     Recent Labs     06/11/19  0600 06/11/19  1338 06/12/19  0335 06/13/19  0327   HGB 8.4* 8.7* 9.0* 8.8*   HCT 27.3* 28.0* 28.6* 28.1*   WBC 18.4* 5.5 24.0* 25.0*   PLTCT 96* 92* 84* 78*     Recent Labs     06/11/19  1630 06/11/19  2036   PHART 7.31* 7.34*   PCO2A 20* 17*   PO2ART 94 86   HCO3A 12.6* 12.7* O2SATACAL 95.8 94.8*           24-hour labs:    Results for orders placed or performed during the hospital encounter of 06/10/19 (from the past 24 hour(s))   TROPONIN-I    Collection Time: 06/12/19  8:39 AM   Result Value Ref Range    Troponin-I 0.28 (H) 0.0 - 0.05 NG/ML   LACTIC ACID (BG - RAPID LACTATE)    Collection Time: 06/12/19 10:12 AM   Result Value Ref Range    Lactic Acid,BG 2.6 (H) 0.5 - 2.0 MMOL/L   BNP (B-TYPE NATRIURETIC PEPTI)    Collection Time: 06/12/19 10:12 AM   Result Value Ref Range    B Type Natriuretic Peptide 1,231.0 (H) 0 - 100 PG/ML   POC GLUCOSE    Collection Time: 06/12/19 11:58 AM   Result Value Ref Range    Glucose, POC 234 (H) 70 - 100 MG/DL   TROPONIN-I    Collection Time: 06/12/19  1:51 PM   Result Value Ref Range    Troponin-I 0.25 (H) 0.0 - 0.05 NG/ML   POC GLUCOSE    Collection Time: 06/12/19  5:02 PM   Result Value Ref Range    Glucose, POC 215 (H) 70 - 100 MG/DL   CULTURE-BLOOD W/SENSITIVITY    Collection Time: 06/12/19  6:45 PM    Specimen: Blood   Result Value Ref Range    Battery Name BLOOD CULTURE     Specimen Description BLOOD  RIGHT  HAND       Special Requests NONE     Culture NO GROWTH 1 DAY     Report Status  CULTURE-BLOOD W/SENSITIVITY    Collection Time: 06/12/19  6:51 PM    Specimen: Blood   Result Value Ref Range    Battery Name BLOOD CULTURE     Specimen Description BLOOD  LEFT  WRIST       Special Requests NONE     Culture NO GROWTH 1 DAY     Report Status     POC GLUCOSE    Collection Time: 06/12/19  9:10 PM   Result Value Ref Range    Glucose, POC 273 (H) 70 - 100 MG/DL   CBC AND DIFF    Collection Time: 06/13/19  3:27 AM   Result Value Ref Range    White Blood Cells 25.0 (H) 4.5 - 11.0 K/UL    RBC 3.61 (L) 4.4 - 5.5 M/UL    Hemoglobin 8.8 (L) 13.5 - 16.5 GM/DL    Hematocrit 16.1 (L) 40 - 50 %    MCV 77.7 (L) 80 - 100 FL    MCH 24.4 (L) 26 - 34 PG    MCHC 31.5 (L) 32.0 - 36.0 G/DL    RDW 09.6 (H) 11 - 15 %    Platelet Count 78 (L) 150 - 400 K/UL MPV 9.6 7 - 11 FL    Neutrophils 93 (H) 41 - 77 %    Lymphocytes 1 (L) 24 - 44 %    Monocytes 5 4 - 12 %    Eosinophils 1 0 - 5 %    Basophils 0 0 - 2 %    Absolute Neutrophil Count 23.32 (H) 1.8 - 7.0 K/UL    Absolute Lymph Count 0.29 (L) 1.0 - 4.8 K/UL    Absolute Monocyte Count 1.26 (H) 0 - 0.80 K/UL    Absolute Eosinophil Count 0.11 0 - 0.45 K/UL    Absolute Basophil Count 0.03 0 - 0.20 K/UL   COMPREHENSIVE METABOLIC PANEL    Collection Time: 06/13/19  3:27 AM   Result Value Ref Range    Sodium 136 (L) 137 - 147 MMOL/L    Potassium 3.6 3.5 - 5.1 MMOL/L    Chloride 110 98 - 110 MMOL/L    Glucose 231 (H) 70 - 100 MG/DL    Blood Urea Nitrogen 43 (H) 7 - 25 MG/DL    Creatinine 0.45 (H) 0.4 - 1.24 MG/DL    Calcium 8.1 (L) 8.5 - 10.6 MG/DL    Total Protein 6.5 6.0 - 8.0 G/DL    Total Bilirubin 2.3 (H) 0.3 - 1.2 MG/DL    Albumin 2.8 (L) 3.5 - 5.0 G/DL    Alk Phosphatase 409 (H) 25 - 110 U/L    AST (SGOT) 732 (H) 7 - 40 U/L    CO2 16 (L) 21 - 30 MMOL/L    ALT (SGPT) 619 (H) 7 - 56 U/L    Anion Gap 10 3 - 12    eGFR Non African American 39 (L) >60 mL/min    eGFR African American 47 (L) >60 mL/min   MAGNESIUM    Collection Time: 06/13/19  3:27 AM   Result Value Ref Range    Magnesium 2.1 1.6 - 2.6 mg/dL   PHOSPHORUS    Collection Time: 06/13/19  3:27 AM   Result Value Ref Range    Phosphorus 1.8 (L) 2.0 - 4.5 MG/DL   POC GLUCOSE    Collection Time: 06/13/19  6:07 AM   Result Value Ref Range    Glucose, POC 193 (H) 70 - 100 MG/DL  Radiology and Other Diagnostic Procedures Review:   No results found.    Pertinent radiologic and diagnostic procedures reviewed.      Rolm Gala, MD Date:  06/13/2019

## 2019-06-14 LAB — PHOSPHORUS: Lab: 1.9 mg/dL — ABNORMAL LOW (ref 2.0–4.5)

## 2019-06-14 LAB — POC GLUCOSE
Lab: 258 mg/dL — ABNORMAL HIGH (ref >60)
Lab: 178 mg/dL — ABNORMAL HIGH (ref 70–100)
Lab: 199 mg/dL — ABNORMAL HIGH (ref 70–100)
Lab: 274 mg/dL — ABNORMAL HIGH (ref 70–100)
Lab: 250 mg/dL — ABNORMAL HIGH (ref 70–100)

## 2019-06-14 LAB — MAGNESIUM: Lab: 2.2 mg/dL — ABNORMAL LOW (ref 1.6–2.6)

## 2019-06-14 LAB — COMPREHENSIVE METABOLIC PANEL
Lab: 111 MMOL/L — ABNORMAL HIGH (ref 98–110)
Lab: 138 MMOL/L — ABNORMAL LOW (ref 137–147)
Lab: 33 mg/dL — ABNORMAL HIGH (ref 7–25)

## 2019-06-14 LAB — CBC AND DIFF: Lab: 17 K/UL — ABNORMAL HIGH (ref 4.5–11.0)

## 2019-06-14 MED ORDER — POTASSIUM, SODIUM PHOSPHATES 280-160-250 MG PO PWPK
2 | Freq: Once | ORAL | 0 refills | Status: CP
Start: 2019-06-14 — End: ?
  Administered 2019-06-14: 12:00:00 500 mg via ORAL

## 2019-06-14 MED ORDER — NYSTATIN 100,000 UNIT/ML PO SUSP
500000 [IU] | Freq: Four times a day (QID) | ORAL | 0 refills | Status: DC | PRN
Start: 2019-06-14 — End: 2019-06-17
  Administered 2019-06-14 – 2019-06-16 (×4): 500000 [IU] via ORAL

## 2019-06-14 MED ORDER — DAPTOMYCIN SYR
8 mg/kg | INTRAVENOUS | 0 refills | Status: DC
Start: 2019-06-14 — End: 2019-06-20
  Administered 2019-06-14 – 2019-06-20 (×7): 500 mg via INTRAVENOUS

## 2019-06-14 MED ORDER — ENOXAPARIN 40 MG/0.4 ML SC SYRG
40 mg | Freq: Every day | SUBCUTANEOUS | 0 refills | Status: DC
Start: 2019-06-14 — End: 2019-06-20
  Administered 2019-06-14 – 2019-06-20 (×7): 40 mg via SUBCUTANEOUS

## 2019-06-14 NOTE — Progress Notes
OCCUPATIONAL THERAPY      Based on discussion with PT, patient's current level of function suggests that patient will progress with one discipline. OT will sign off at this time. Please re-consult if change in functional status occurs.    Keri Tavella Stanley, MOT R/L [id number]

## 2019-06-14 NOTE — Case Management (ED)
Case Management Progress Note    NAME:Dustin Blackwell                          MRN: [id number]              DOB:May 09, 1946          AGE: 73 y.o.  ADMISSION DATE: 06/10/2019             DAYS ADMITTED: LOS: 4 days      Today???s Date: 06/14/2019    Plan  Discharge planning ongoing  Continue ICU Care; DNAR-FI  Transfer to floor   ID, Palliative Care, and GI following   PT/OT  ???  TPOPP needs to be completed prior to discharge  ???  Interventions  ??? Support  Kelly-daughter: [phone]  Eunice-niece: [phone]  ???  ??? Info or Referral            ??? Discharge Planning  SW reviewed progress notes and discussed patient with ICU Team and RNCM. Transfer to floor. Awaiting sensitivities to cultures. Anticipate abx at discharge but uncertain if IV vs PO.   ???  Palliative following to help with goals of care as condition evolves. Patient has not sought treatment to pancreatic cancer and has elected to not undergo any chemotherapy or surgery, however, has also declined hospice.    SW met with patient, his cousin, and Breck Coons was on phone to discuss discharge planning. SW discussed current recommendations for inpatient. SW also discussed possible need for IV Abx. Patient and family are adamant he will return home and not to placement. They will help him as needed. However, they do confirm he will be alone at home. Breck Coons confirms they will have someone (maybe her daughter Riley Lam) help with Iv Abx. SW encouraged patient to continue working with PT/OT.     SW updated PT about conversation. She feels patient will likely progress to home. She will put him on the weekend report should he stay inpatient through the weekend.   ???  RNCM, SW-Faryal Marxen, and SW-Shoemaker continue to follow for support and discharge planning.   ???  ??? Medication Needs  ???  ??? Financial  Medicare and [place] Medicaid  ???  ??? Legal  DPOA onfile: Edwena Bunde  ???  ??? Other  Patient lives in???1???story home alone and was independent of cares prior to admission. Patient does not drive. Dtr Tresa Endo or niece Riley Lam able to provide transportation to appointments and errands. Tresa Endo states pt also uses the bus system.  Patient owns???RW???and has used [place] in the recent past. Pt agreeable to using this agency again if needed.     Disposition  ? Expected Discharge Date    Expected Discharge Date: 06/18/19  Discharge Planning Comments: dc planning ongoing     ? Transportation   Does the patient need discharge transport arranged?: No  Transportation Name, Phone and Availability #1: dtr Stage manager Name, Phone and Availability #2: Noah Charon  Does the patient use Medicaid Transportation?: No     ? Next Level of Care (Acute Psych discharges only)      ? Discharge Disposition       Lenna Sciara, LMSW   [phone] (phone)  [phone] (pager)
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
## 2019-06-14 NOTE — Progress Notes
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 He was scheduled to undergo an ERCP with a biliary stent on 06/10/19, however, due to his low blood pressure the procedure was canceled. On the evening of 06/10/19, a rapid response was called for low blood pressure requiring pressors and he was subsequently transferred to the MICU.    Interval Hx (8/27): Linezolid > daptomycin per ID,  2.1L UOP, decrease steroid to qday, got 3 doses hydrocortisone yesterday so will have BID today and qd tomorrow. PICC pulled and to floor.     Neuro/Psych:   # Acute metabolic encephalopathy  - A&O x 4, due to multiple factors including septic shock, worsening liver/renal function     Plan:   > CTM      Cardiac:   # Septic Shock  # Chronic Systolic Heart Failure  # HTN  -- Pt RR on 06/10/19 PM for hypotension requiring pressor support and subsequent transfer to ICU; given known likely biliary stent failure and RUQ pain, fever, and jaundice this is likely septic shock from acute cholangitis  -- Dual chamber ICD in place -   - 10/2015 2D echo w/LVEF 20-25%, follows with [facility] cardiology  - 03/2018 2D echo with LVEF 65%  - PTA ASA 81mg , Coreg 25mg  BID, atorvastatin 40mg   - Pt received 3L of IVF during RR + 1 L NS in MICU  -- Admission Troponin - 0.49 > 0.36 -- 06/11/19 ECHO: EF ~25%  -- Medications:   -- Pressors: Norepinephrine - OFF   -- Hydrocortisone 50 mg Q6    Plan:   > Continue norepinephrine; wean as tolerated with goal MAP>65   > Start taper of hydrocortisone from q6hr to 50mg  BID -> plan for qday tomorrow (8/27)   > Hold PTA Coreg; hold atorvastatin   > Continue PTA ASA 81mg     Pulmonary:    # COPD  -- 8/24 CXR; Mostly unremarkable; trace increased interstitial markings  -- No PTA inhalers  -- SpO2 > 92% 2L o/n    Plan:   >  CTM SpO2 w/ goal greater than 92%      GI:  # Acute cholangitis (rule out)  # Acute pancreatitis  # Elevated liver enzymes  -- Hx of bilary stent placement 2/2 pancreatic cancer  -- Presented to OSH w/ RUQ pain - OSH CT w/ pneumobilia concerning for stent obstruction  -- New stent placed per GI on 8/24   -- Lipase 345  -- T. 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, improving  # Metabolic Acidosis w/ Elevated Anion Gap  # Elevate Lactate  # Hypomagnesiemia  -- Lactate 3.3 < from peak of ~8  -- Serum bicarb: 12  -- UOP: 2.1L o/n  -- Baseline Cr: 1.5  -- BUN & Cr: 50 / 2.15 from peak of 3.13 > 1.3  -- 4L IVF + fluids w/ meds (+7L)   -- K 3.9 w/ AM labs    Plan:   > Monitor UOP/Cr   > Fluid goal: Net even   > IVF hydration, replace lytes PRN      ID:   # Enterococcus bacteremia  # Elevated Procalcitonin  # Acute Cholangitis  -- See GI above  -- Procal > 100  -- Tmax 38.3   -- WBC: Increased to 24.0 this AM  < 5.5 < 18.4   -- UA: unremarkable  -- Antibiotics: Meropenem + Linezolid  -- Blood cx: G+ cocci > Follow speciation and 2nd blood cultures    Plan:   > ID consult, appreciate recommendations    > Linezolid > Daptomycin (8 mg / kg q24)   > Continue Meropenem   > Ck q 5-7d     Heme/Onc:   # Stage 1A pancreatic cancer -- Follows with Dr. Tamsen Meek at [facility]  -- Pt has elected to not undergo any chemotherapy or surgery, however, has also declined hospice  -- OSH CT scan prior to transfer reportedly showing a new mass in the pelvis concerning for metastasis, pneumobilia and mild ductal dilatation  -- 12/2018 CA 19-9 774  -- He is likely understaged at this time given his new scan, stent failure  -- Hgb: 8.4  -- Plt: 96    Plan:   > Monitor Hgb & Plt. Transfuse if hgb<7 or plt<20.   > DVT ppx: Heparin 5k SQ q8h     > Could consider oncology consult once on the floor as he is now expressing a desire for possible treatment     Endocrine:   # T2DM  -- Last A1c 10.4 on 05/2017  -- PTA metformin 1000mg  BID, sitagliptin 100 mg daily, and empagliflozin 25 mg daily  -- Blood Glucose: 196  Plan:   > Regular insulin + Mid dose correction factor    > Increase NPH to 8 BID   > Blood glucose goal 100-180mg /dl    FEN:   Guided by [facility] / Replace PRN / Regular diet      Prophylaxis Review:  Lines:  2 PIV; ART line (Placed 8/24); PICC triple lumen   Insulin: MDCF  Urinary Catheter: None  VTE ppx: SCDs; Heparin 5k SQ q8h  GI ppx: Pantoprazole  PT/OT: Ordered    Disposition: continue micu admission, anticipate possible floor status tommorow     Code status: DNAR-Full Intervention     Primary service: MICU1    Consults:  GI and Palliative Care    ___________________________________________________________________  SUBJECTIVE   Dustin Blackwell is a 73 y.o. male.  Feeling much improved today. Some diarrhea o/n.    ROS:   General: Feels improved, fatigue improved  Cardio: No chest pain or syncope   Pulm: Reports cough, no SOB  GI: Diarrhea o/n, Denies abdominal pain  GU: Reports significant urine output yesterday    Medications:  Scheduled Meds:allopurinoL (  ZYLOPRIM) tablet 100 mg, 100 mg, Oral, QDAY  aspirin EC tablet 81 mg, 81 mg, Oral, QDAY  heparin (porcine) PF syringe 5,000 Units, 5,000 Units, Subcutaneous, Q8H hydrocortisone PF (Solu-CORTEF) injection 50 mg, 50 mg, Intravenous, BID  insulin aspart U-100 (NOVOLOG FLEXPEN) injection PEN 0-12 Units, 0-12 Units, Subcutaneous, ACHS (22)  insulin NPH (HUMULIN N KwikPen) injection PEN 8 Units, 8 Units, Subcutaneous, BID(8-21)  linezolid  (ZYVOX)  600 mg/D5W 300 mL IVPB, 600 mg, Intravenous, Q12H*  meropenem (MERREM) 1 g in sodium chloride 0.9% (NS) 100 mL IVPB (MB+), 1 g, Intravenous, Q12H*  pantoprazole DR (PROTONIX) tablet 40 mg, 40 mg, Oral, QDAY  sodium chloride PF 0.9% flush 5-10 mL, 5-10 mL, Flush, FLUSH TID    Continuous Infusions:  ??? EPINEPHrine (ADRENALIN) 4 mg in dextrose 5% (D5W) 250 mL IV drip (std conc) Stopped (06/11/19 2215)   ??? norepinephrine (LEVOPHED) 16 mg in dextrose 5% (D5W) 250 mL IV drip (quad conc) 0.01 mcg/kg/min (06/13/19 1628)   ??? phenylephrine (NEO-SYNEPHRINE) 80 mg in sodium chloride 0.9% (NS) 500 mL IV drip (quad conc) Stopped (06/11/19 1902)   ??? sodium chloride 0.9 %   infusion 1,000 mL (06/10/19 2151)   ??? vasopressin (VASOSTRICT) 20 Units in sodium chloride 0.9% (NS) 100 mL IV drip (std conc) Stopped (06/12/19 1700)     PRN and Respiratory Meds:acetaminophen/lidocaine/antacid DS(#) Q3H PRN, fentaNYL citrate PF Q2H PRN, polyethylene glycol 3350 QDAY PRN       OBJECTIVE                     Vital Signs: Last Filed                  Vital Signs: 24 Hour Range   BP: 91/68 (08/27 0500)  ABP: 91/45 (08/26 1600)  Temp: 37.1 ???C (98.7 ???F) (08/27 0400)  Pulse: 84 (08/27 0500)  Respirations: 16 PER MINUTE (08/27 0500)  SpO2: 98 % (08/27 0500)  BP: (85-106)/(59-70)   ABP: (91-116)/(45-59)   Temp:  [36.4 ???C (97.5 ???F)-37.1 ???C (98.7 ???F)]   Pulse:  [83-99]   Respirations:  [15 PER MINUTE-31 PER MINUTE]   SpO2:  [97 %-100 %]     Intensity Pain Scale (Self Report): (not recorded) Vitals:    06/10/19 0800 06/11/19 1524   Weight: 67.6 kg (149 lb 0.5 oz) 67.6 kg (149 lb)              Intake/Output Summary:  (Last 24 hours) Intake/Output Summary (Last 24 hours) at 06/14/2019 0605  Last data filed at 06/14/2019 0400  Gross per 24 hour   Intake 1483.55 ml   Output 2160 ml   Net -676.45 ml         Physical Exam:    Blood pressure 91/68, pulse 84, temperature 37.1 ???C (98.7 ???F), height 170.2 cm (67), weight 67.6 kg (149 lb), SpO2 98 %.    General: No acute distress, and appears younger than stated age no acute distress, thin, appears younger than stated age  HEENT: Icterus resolved, EOM intact, PERRL   Heart: Regular rate and rhythm  Lungs: Clear to auscultation bilaterally   Abdomen: Soft, normal bowel sounds, nontender  Extremities: No edema   Skin: No lesions/rashes     Point of Care Testing:  (Last 24 hours)  Glucose: (!) 196 (06/14/19 0410)  POC Glucose (Download): (!) 178 (06/14/19 0409)    Lab Review:    Recent Labs     06/11/19  2320 06/12/19  0335 06/13/19  0327 06/14/19  0410   NA 134* 134* 136* 138   K 4.7 4.4 3.6 3.9   CL 108 108 110 111*   CO2 11* 12* 16* 19*   BUN 52* 50* 43* 33*   CR 2.17* 2.15* 1.74* 1.30*   GLU 250* 246* 231* 196*   GAP 15* 14* 10 8   GFR 30* 30* 39* 54*   MG  --  1.6 2.1 2.2   CA 7.7* 7.6* 8.1* 8.1*   PO4  --  3.6 1.8* 1.9*     Recent Labs     06/12/19  0335 06/13/19  0327 06/14/19  0410   ALKPHOS 436* 390* 363*   AST 563* 732* 278*   ALT 374* 619* 426*   TOTPROT 6.3 6.5 6.1   TOTBILI 3.7* 2.3* 1.6*   ALBUMIN 2.8* 2.8* 2.5*     Recent Labs     06/11/19  1338 06/12/19  0335 06/13/19  0327 06/14/19  0410   HGB 8.7* 9.0* 8.8* 8.6*   HCT 28.0* 28.6* 28.1* 26.8*   WBC 5.5 24.0* 25.0* 17.7*   PLTCT 92* 84* 78* 64*     Recent Labs     06/11/19  1630 06/11/19  2036   PHART 7.31* 7.34*   PCO2A 20* 17*   PO2ART 94 86   HCO3A 12.6* 12.7*   O2SATACAL 95.8 94.8*           24-hour labs:    Results for orders placed or performed during the hospital encounter of 06/10/19 (from the past 24 hour(s))   POC GLUCOSE    Collection Time: 06/13/19  6:07 AM   Result Value Ref Range    Glucose, POC 193 (H) 70 - 100 MG/DL POC GLUCOSE    Collection Time: 06/13/19  9:03 AM   Result Value Ref Range    Glucose, POC 194 (H) 70 - 100 MG/DL   POC GLUCOSE    Collection Time: 06/13/19 12:34 PM   Result Value Ref Range    Glucose, POC 248 (H) 70 - 100 MG/DL   POC GLUCOSE    Collection Time: 06/13/19  6:17 PM   Result Value Ref Range    Glucose, POC 233 (H) 70 - 100 MG/DL   POC GLUCOSE    Collection Time: 06/13/19  8:49 PM   Result Value Ref Range    Glucose, POC 258 (H) 70 - 100 MG/DL   POC GLUCOSE    Collection Time: 06/14/19  4:09 AM   Result Value Ref Range    Glucose, POC 178 (H) 70 - 100 MG/DL   CBC AND DIFF    Collection Time: 06/14/19  4:10 AM   Result Value Ref Range    White Blood Cells 17.7 (H) 4.5 - 11.0 K/UL    RBC 3.49 (L) 4.4 - 5.5 M/UL    Hemoglobin 8.6 (L) 13.5 - 16.5 GM/DL    Hematocrit 16.1 (L) 40 - 50 %    MCV 76.9 (L) 80 - 100 FL    MCH 24.6 (L) 26 - 34 PG    MCHC 32.0 32.0 - 36.0 G/DL    RDW 09.6 (H) 11 - 15 %    Platelet Count 64 (L) 150 - 400 K/UL    MPV 9.8 7 - 11 FL    Segmented Neutrophils 91 (H) 41 - 77 %    Lymphocytes 3 (L) 24 - 44 %    Monocytes 6 4 - 12 %  ANISO PRESENT     HYPO PRESENT     POIK PRESENT     POLY PRESENT     MICRO PRESENT     Target PRESENT     Platelet Estimate MOD DEC     Burr Cells PRESENT     Absolute Neutrophil Count Manual 16.11 (H) 1.8 - 7.0 K/UL   COMPREHENSIVE METABOLIC PANEL    Collection Time: 06/14/19  4:10 AM   Result Value Ref Range    Sodium 138 137 - 147 MMOL/L    Potassium 3.9 3.5 - 5.1 MMOL/L    Chloride 111 (H) 98 - 110 MMOL/L    Glucose 196 (H) 70 - 100 MG/DL    Blood Urea Nitrogen 33 (H) 7 - 25 MG/DL    Creatinine 1.61 (H) 0.4 - 1.24 MG/DL    Calcium 8.1 (L) 8.5 - 10.6 MG/DL    Total Protein 6.1 6.0 - 8.0 G/DL    Total Bilirubin 1.6 (H) 0.3 - 1.2 MG/DL    Albumin 2.5 (L) 3.5 - 5.0 G/DL    Alk Phosphatase 096 (H) 25 - 110 U/L    AST (SGOT) 278 (H) 7 - 40 U/L    CO2 19 (L) 21 - 30 MMOL/L    ALT (SGPT) 426 (H) 7 - 56 U/L    Anion Gap 8 3 - 12 eGFR Non African American 54 (L) >60 mL/min    eGFR African American >60 >60 mL/min   MAGNESIUM    Collection Time: 06/14/19  4:10 AM   Result Value Ref Range    Magnesium 2.2 1.6 - 2.6 mg/dL   PHOSPHORUS    Collection Time: 06/14/19  4:10 AM   Result Value Ref Range    Phosphorus 1.9 (L) 2.0 - 4.5 MG/DL       Radiology and Other Diagnostic Procedures Review:   No results found.    Pertinent radiologic and diagnostic procedures reviewed.      Ventura Sellers, MD Date:  06/14/2019

## 2019-06-14 NOTE — Progress Notes
PHYSICAL THERAPY  PROGRESS NOTE        Name: Dustin Blackwell        MRN: [id number]          DOB: 04/06/1946          Age: 73 y.o.  Admission Date: 06/10/2019             LOS: 4 days        Mobility  Patient Turn/Position: Self  Progressive Mobility Level: Walk in hallway  Distance Walked (feet): 250 ft  Level of Assistance: Assist X1  Assistive Device: Walker(progressing to none)  Time Tolerated: 11-30 minutes  Activity Limited By: Fatigue    Subjective  Significant hospital events: PMH of systolic heart failure s/p dual-chamber ICD with recovered EF, COPD, T2DM, hypertension, OSA, former smoker, stage Ia pancreatic adenocarcinoma not on any treatment, ERCP s/p biliary drain placement, arthritis and chronic back pain who was admitted on 06/10/19 via transfer from OSH for worsening RUQ pain. He was scheduled to undergo an ERCP with a biliary stent on 06/10/19, however, due to his low blood pressure the procedure was canceled. On the evening of 06/10/19, a rapid response was called for low blood pressure requiring pressors and he was subsequently transferred to the MICU. Off levo 8/27.  Mental / Cognitive Status: Alert;Oriented;Cooperative;Follows Commands  Persons Present: (Cousin)  Pain: Patient has no complaint of pain  Pain Interventions: Patient agrees to participate in therapy;Patient assisted into position of comfort  Comments: Room Air    Ambulation Assist: Independent Mobility in [employer] without Device  Patient Owned Equipment: Water engineer doesn't know where it is )  Home Situation: Lives Alone  Type of Home: House  Entry Stairs: 3-5 Stairs;Rail on 1 Side  In-Home Stairs: No Stairs    Bed Mobility/Transfer  Bed Mobility: Supine to Sit: Modified Independent;Head of Bed Elevated  Bed Mobility: Sit to Supine: Modified Independent;HOB Elevated    Transfer Type: Sit to/from Stand  Transfer: Assistance Level: From;Bed;Standby Assist  Transfer: Assistive Device: Nurse, adult Transfers: Type Of Assistance: For Safety Considerations    End Of Activity Status: In Bed;Nursing Notified;Instructed Patient to Request Assist with Mobility;Instructed Patient to Use Call Light    Gait  Gait Distance: 250 feet  Gait: Assistance Level: Minimal Assist(contact guard assistance)  Gait: Assistive Device: Roller Walker  Gait: Descriptors: Pace: Slow;Swing-Through Gait;Decreased step length  Comments: Patient denies dizziness and reports it feels good to be up walking around. Patient able to progress off walker to no device for final ~50' of ambulation -- minimal assistance and mild unsteadiness observed. Recommend continued use of walker with staff at this time.     Activity Limited By: Complaint of Fatigue    Education  Persons Educated: Patient  Patient Barriers To Learning: None Noted  Teaching Methods: Verbal Instruction  Patient Response: Verbalized Understanding  Topics: Plan/Goals of PT Interventions;Mobility Progression;Safety Awareness;Up with Assist Only;Importance of Increasing Activity;Recommend Continued Therapy;Therapy Schedule;Equipment Recommendations;Ambulate With Nursing     Comments: PT encouraged patient to ambulate once more with nursing staff assistance.     Assessment/Progress  Assessment/Progress: Progressing Toward Goals    AM-PAC 6 Clicks Basic Mobility Inpatient  Turning from your back to your side while in a flat bed without using bed rails: None  Moving from lying on your back to sitting on the side of a flatbed without using bedrails : None  Moving to and from a bed to a chair (including a wheelchair): A Little  Standing up  from a chair using your arms (e.g. wheelchair, or bedside chair): None  To walk in hospital room: A Little  Climbing 3-5 steps with a railing: A Little  Raw Score: 21  Standardized (T-scale) Score: 45.55  Basic Mobility CMS 0-100%: 29.52  CMS G Code Modifier for Basic Mobility: CJ    Goals  Goal Formulation: With Patient Time For Goal Achievement: 5 days  Patient Will Go Supine To/From Sit: w/ Stand By Assist  Patient Will Transfer Sit to Stand: w/ Stand By Assist  Patient Will Ambulate: 101-150 Feet, w/ No Device, w/ Stand By Assist  Patient Will Go Up / Down Stairs: 3-5 Stairs, w/ Stand By Assist    Plan  Treatment Interventions: Mobility Training  Plan Frequency: 3-5 Days per Week  PT Plan for Next Visit: Progress ambulation - try to wean from walker as able. Trial stairs next session.      P.T. mobility aide to assist with ongoing mobilization and exercise per instructions of licensed physical therapy staff.     PT Discharge Recommendations  Recommendation: Home with intermittent supervision/assistance  Recommendation for Therapy Post Discharge: Home health  Patient Currently Requires Equipment: Walker with wheels; Patient requires the use of a walker with wheels to complete ADL???s in the home including meal preparation, ambulation the bathroom for toileting, bathing and grooming, and safe home mobility.  Patient is unable to complete these ADL???s with a cane or crutch and can safely use the walker.     Therapist: Marcelle Overlie, PT, DPT [phone]  Date: 06/14/2019

## 2019-06-15 LAB — COMPREHENSIVE METABOLIC PANEL
Lab: 1.2 mg/dL (ref 0.4–1.24)
Lab: 136 MMOL/L — ABNORMAL LOW (ref >60)
Lab: 227 mg/dL — ABNORMAL HIGH (ref 70–100)
Lab: 3.9 MMOL/L — ABNORMAL LOW (ref >60)

## 2019-06-15 LAB — POC GLUCOSE
Lab: 269 mg/dL — ABNORMAL HIGH (ref 70–100)
Lab: 221 mg/dL — ABNORMAL HIGH (ref 70–100)
Lab: 233 mg/dL — ABNORMAL HIGH (ref 70–100)
Lab: 191 mg/dL — ABNORMAL HIGH (ref 70–100)
Lab: 211 mg/dL — ABNORMAL HIGH (ref 70–100)

## 2019-06-15 LAB — PHOSPHORUS: Lab: 1.4 mg/dL — CL (ref 2.0–4.5)

## 2019-06-15 LAB — CBC AND DIFF: Lab: 12 K/UL — ABNORMAL HIGH (ref 4.5–11.0)

## 2019-06-15 LAB — MAGNESIUM: Lab: 2.1 mg/dL — ABNORMAL LOW (ref 1.6–2.6)

## 2019-06-15 LAB — CULTURE-BLOOD W/SENSITIVITY

## 2019-06-15 MED ORDER — POTASSIUM PHOSPHATE, MONOBASIC 500 MG PO TBSO
1 | Freq: Once | ORAL | 0 refills | Status: CP
Start: 2019-06-15 — End: ?
  Administered 2019-06-15: 11:00:00 1 via ORAL

## 2019-06-15 MED ORDER — MULTIVIT-IRON-FA-CALCIUM-MINS 9 MG IRON-400 MCG PO TAB
1 | Freq: Every day | ORAL | 0 refills | Status: DC
Start: 2019-06-15 — End: 2019-06-20
  Administered 2019-06-16: 13:00:00 1 via ORAL

## 2019-06-15 MED ORDER — ALLOPURINOL 300 MG PO TAB
300 mg | Freq: Every day | ORAL | 0 refills | Status: DC
Start: 2019-06-15 — End: 2019-06-20
  Administered 2019-06-16 – 2019-06-19 (×4): 300 mg via ORAL

## 2019-06-15 MED ADMIN — SODIUM CHLORIDE 0.9 % IV SOLP [27838]: 1000 mL | INTRAVENOUS | @ 11:00:00 | Stop: 2019-06-15 | NDC 00338004904

## 2019-06-15 NOTE — Case Management (ED)
Case Management Progress Note    NAME:Dustin Blackwell                          MRN: [id number]              DOB:1946/08/31          AGE: 73 y.o.  ADMISSION DATE: 06/10/2019             DAYS ADMITTED: LOS: 5 days      Todays Date: 06/15/2019    Plan  Patient to remain inpatient over the weekend with possible discharge home with home health and IV abx next week.    Interventions  ? Support   Support: Pt/Family Updates re:POC or DC Plan    NCM reviewed EMR.   NCM attended and participated in MPM huddle.   ID consulted and following abx regimen. Currently receiving IV abx for septic shock.   NCM will continue to follow for discharge planning.  ? Info or Referral      ? Discharge Planning   Discharge Planning: Durable Medical Equipment and Supplies, Home Health, Home Infusion-[facility]  ? Medication Needs   Medication Needs: No Needs Identified   Financial   Financial: No Needs Identified  ? Legal   Legal: No Needs Identified  ? Other   Other/None: No needs identified    Disposition  ? Expected Discharge Date    Expected Discharge Date: 06/18/19  Expected Discharge Time: 1300  Discharge Planning Comments: dc planning ongoing  ? Transportation   Does the patient need discharge transport arranged?: No  Transportation Name, Phone and Availability #1: dtr Veterinary surgeon Name, Phone and Availability #2: [facility]  Does the patient use Medicaid Transportation?: No  ? Next Level of Care (Acute Psych discharges only)      ? Discharge Disposition                      Durable Medical Equipment      No service has been selected for the patient.      [place] [facility]      No service has been selected for the patient.      [place]      No service has been selected for the patient.      [facility]      No service has been selected for the patient.        Mellody Drown, BSN, RN  Med Private M Nurse Case Manager  Phone: [phone]  Pager: [phone]

## 2019-06-15 NOTE — Care Plan
Problem: Discharge Planning  Goal: Participation in plan of care  Outcome: Goal Ongoing  Goal: Knowledge regarding plan of care  Outcome: Goal Ongoing  Goal: Prepared for discharge  Outcome: Goal Ongoing     Problem: Infection, Risk of  Goal: Absence of infection  Outcome: Goal Ongoing  Goal: Knowledge of Infection Control Procedures  Outcome: Goal Ongoing     Problem: Falls, High Risk of  Goal: Absence of falls-Adult Patient  Outcome: Goal Ongoing  Goal: Absence of Falls-Pediatric patient  Outcome: Goal Ongoing     Problem: Skin Integrity  Goal: Skin integrity intact  Outcome: Goal Ongoing  Goal: Healing of skin (Wound & Incision)  Outcome: Goal Ongoing  Goal: Healing of skin (Pressure Injury)  Outcome: Goal Ongoing     Problem: Infection, Risk of, Central Venous Catheter-Associated Bloodstream Infection  Goal: Absence of CVC Associated Bloodstream infection  Outcome: Goal Ongoing     Problem: Mobility/Activity Intolerance  Goal: Maximize functional ADL's and mobility outcomes  Outcome: Goal Ongoing

## 2019-06-15 NOTE — Progress Notes
PALLIATIVE CARE INPATIENT NOTE     Name: Dustin Blackwell            MRN: [id number]                DOB: 12-07-1945          Age: 73 y.o.  Admission Date: 06/10/2019             LOS: 5 days    ASSESSMENT     Dustin Blackwell is a 73 y.o. 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 He is admitted to the ICU with acute cholangitis, acute pancreatitis and septic shock requiring pressors  s/p ERCP and stenting, throughout his ICU stay patient continued to improve, he was completely weaned off pressors, renal function returned to baseline with creatinine of 1.3 and was transferred to floor status 8/27.      Advance Care Planning:  Previous ACP discussion (Please see ACP note of Dr. Inez Catalina 06/11/2019). Despite having expressed worries about hospitalizations and decreasing quality of life.Patient expressed willingness to pursue aggressive treatments and interventions with the hope of having more time for his family. Patient's family are supportive of patient's goals of care.     Code Status: DNAR-Full Intervention  Identified Health Care Decision Maker: reviewed DPOA paperwork on file with [employer]   Davis,Kelly - daughter/DPOA  Work Phone: [phone]  Mobile Phone: [phone]    [facility]  Home Phone: [phone]  Relation: Relative    Prognosis (Estimated):  Based on current function, current medical issues, goals of care, and prognosis models, my estimated prognosis is Unknown.    Potential Disposition: Too soon to determine  PLAN       #Abdominal pain   - continue GI cocktail as needed  - patient has not been requiring IV fentanyl    #Physical Deconditioning  - continue PT/OT    #Goals of care    Discussion:   Met with patient and his caregiver/friend Lonie.   He is in good spirits and about to eat his breakfast. He stated being so thankful for how much he his condition has improved. Per Lonie they have not heard anything about discharge plans.      RECOMMENDATIONS:  Palliative care team is planning to sign off. Patient/family has clears goals and expressed wanting to continue current treatment to stabilize patient condition and hope to extend life. His symptoms seems to be well controlled managed by primary team.   Thank you very much for allowing [place] to participate in the care of this patient/family. Please do not hesitate to re consult [place] for any palliative care needs.    Patient care discussed with Palliative care attending Dr Inez Catalina MD    Total time spent in care of this patient today on unit is 30 minutes, more than 50% of the time was spent in face to face contact with patient/family at bedside doing coordination of care and counseling as outlined above.   SUBJECTIVE     CC/Reason for Visit: goals of care.    Subjective: Patient denies any pain, or shortness of breath. Endorses feeling tired for not having a good sleep as he got transferred to a new room.     ROS:    A 14 point review of systems was negative except for:  Constitutional: fatigue, weakness  - poor sleep last night  OBJECTIVE  Blood pressure 95/62, pulse 87, temperature 36.8 ???C (98.3 ???F), height 170.2 cm (67), weight 67.6 kg (149 lb), SpO2 98 %.  Physical Exam   Constitutional: He is oriented to person, place, and time.   HENT:   Head: Normocephalic and atraumatic.   Eyes: Pupils are equal, round, and reactive to light. EOM are normal.   Cardiovascular: Regular rhythm and intact distal pulses.   Pulmonary/Chest: Effort normal and breath sounds normal. No respiratory distress.   Abdominal: He exhibits no distension.   Musculoskeletal:         General: No edema.   Neurological: He is alert and oriented to person, place, and time.   Skin: Skin is warm and dry.   Psychiatric: Mood, memory, affect and judgment normal.   Nursing note and vitals reviewed. Lab Results:  CBC   Lab Results   Component Value Date/Time    WBC 12.6 (H) 06/15/2019 02:55 AM    HGB 8.7 (L) 06/15/2019 02:55 AM    PLTCT 59 (L) 06/15/2019 02:55 AM     Lab Results   Component Value Date/Time    NEUT 90 (H) 06/15/2019 02:55 AM    ANC 11.33 (H) 06/15/2019 02:55 AM      Chemistries   Lab Results   Component Value Date/Time    NA 136 (L) 06/15/2019 02:55 AM    K 3.9 06/15/2019 02:55 AM    BUN 30 (H) 06/15/2019 02:55 AM    CR 1.24 06/15/2019 02:55 AM    GLU 227 (H) 06/15/2019 02:55 AM     Lab Results   Component Value Date/Time    CA 7.9 (L) 06/15/2019 02:55 AM    PO4 1.4 (LL) 06/15/2019 02:55 AM    ALBUMIN 2.5 (L) 06/15/2019 02:55 AM    TOTPROT 6.1 06/15/2019 02:55 AM    ALKPHOS 397 (H) 06/15/2019 02:55 AM    AST 126 (H) 06/15/2019 02:55 AM    ALT 300 (H) 06/15/2019 02:55 AM    TOTBILI 1.3 (H) 06/15/2019 02:55 AM    GFR 57 (L) 06/15/2019 02:55 AM    GFRAA >60 06/15/2019 02:55 AM        Other Pertinent Diagnostic Results: reviewed    Agrifina Kalla Watson  ANP-BC, [facility]  Palliative Care Nurse Practitioner  Available on [place]  Office: [phone]  Nights/Weekends - Page [phone] for Palliative Care On-Call

## 2019-06-15 NOTE — Progress Notes
PHYSICAL THERAPY  MOBILITY NOTE      Name: Dustin Blackwell        MRN: [id number]          DOB: January 14, 1946          Age: 73 y.o.  Admission Date: 06/10/2019             LOS: 5 days      Patient was assigned for activity with the mobility aide by the supervising therapist.  Patient declined to participate as he had recently mobilized to the bathroom and was wishing to rest.  Patient agreed to mobilizing each day this weekend.  Rehab technician will continue to follow patient.       Rehab Technician: Jeoffrey Massed  Date: 06/15/2019

## 2019-06-16 LAB — CULTURE-BLOOD W/SENSITIVITY: Lab: POSITIVE

## 2019-06-16 LAB — POC GLUCOSE
Lab: 200 mg/dL — ABNORMAL HIGH (ref 70–100)
Lab: 153 mg/dL — ABNORMAL HIGH (ref 70–100)
Lab: 237 mg/dL — ABNORMAL HIGH (ref 70–100)
Lab: 317 mg/dL — ABNORMAL HIGH (ref 70–100)

## 2019-06-16 LAB — COMPREHENSIVE METABOLIC PANEL: Lab: 138 MMOL/L — ABNORMAL LOW (ref 137–147)

## 2019-06-16 LAB — MAGNESIUM: Lab: 1.9 mg/dL — ABNORMAL HIGH (ref >60)

## 2019-06-16 LAB — PHOSPHORUS: Lab: 1.5 mg/dL — ABNORMAL LOW (ref 2.0–4.5)

## 2019-06-16 LAB — CBC AND DIFF: Lab: 10 K/UL — ABNORMAL LOW (ref >60)

## 2019-06-16 MED ORDER — SODIUM PHOSPHATE IVPB
16 MMOL | Freq: Once | INTRAVENOUS | 0 refills | Status: CP
Start: 2019-06-16 — End: ?
  Administered 2019-06-16 (×2): 16 mmol via INTRAVENOUS

## 2019-06-16 NOTE — Progress Notes
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 He was scheduled to undergo an ERCP with a biliary stent on 06/10/19, however, due to his low blood pressure the procedure was canceled. On the evening of 06/10/19, a rapid response was called for low blood pressure requiring pressors and he was subsequently transferred to the MICU.  ???  ???  # Acute metabolic encephalopathy  - A&O x 4, due to multiple factors including septic shock, worsening liver/renal function   - Resolved  ???  # Septic Shock  # Chronic Systolic Heart Failure  # HTN  - Pt RR on 06/10/19 PM for hypotension requiring pressor support and subsequent transfer to ICU; given known likely biliary stent failure and RUQ pain, fever, and jaundice this is likely septic shock from acute cholangitis  - Dual chamber ICD in place -   - 10/2015 2D echo w/LVEF 20-25%, follows with [facility] cardiology  - 03/2018 2D echo with LVEF 65%  - PTA ASA 81mg , Coreg 25mg  BID, atorvastatin 40mg   - Pt received 3L of IVF during RR + 1 L NS in MICU  - Admission Troponin - 0.49 > 0.36  - 06/11/19 ECHO: EF ~25%  - Weaned off pressors, tapering down stress dose steroids, down to 50 mg hydrocortisone every 12 hours. Will finish tonight.   - Hold PTA Coreg; hold atorvastatin  - Continue PTA ASA 81mg   - Repeat 2D echo in 4 weeks when recovered.   ???   # COPD  - 8/24 CXR; Mostly unremarkable; trace increased interstitial markings  - No PTA inhalers  - SpO2 > 92% 2L o/n    # Acute cholangitis (rule out)  # Acute pancreatitis # Elevated liver enzymes  - Hx of bilary stent placement 2/2 pancreatic cancer  - Presented to OSH w/ RUQ pain - OSH CT w/ pneumobilia concerning for stent obstruction  - New stent placed per GI on 8/24   - Improving cholestatic hepatitis  - Meropenem changed to Ertapenem 8/28. Continue daptomycin (8/27)  - Will place PICC for discharge.     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    # Hypophosphatemia, hypomagnesemia  - Replace aggressively and monitor   - Likely has developed refeeding syndrome and will need critical replacements  ???  # Enterococcus bacteremia  # Elevated Procalcitonin  # Acute Cholangitis  - BC + smear for gram+ cocci resembling streptococc. Culture w enterococcus casselifllavus and faecalis 8/23  - Procalcitonin > 100,000  - Repeat BC drawn- Enterococcus faecalis- 8/24  - TTE showed no vegetations on ICD leads, aortic and pulmonary valves poorly visualized 8/24  - BC ngtd (3 days)  - WBC better, down to 10.1  ???  # Stage 1A pancreatic cancer  - Follows with Dr. Tamsen Meek at [facility]  - Pt has elected to not  undergo any chemotherapy or surgery, however, has also declined hospice  - OSH CT scan prior to transfer???reportedly showing a new mass in the pelvis concerning for metastasis, pneumobilia and mild ductal dilatation  - 12/2018 CA 19-9 774  - He is likely understaged at this time given his new scan, stent failure  - Will discuss with oncology     # T2DM  - Last A1c 10.4 on 05/2017  - PTA metformin 1000mg  BID, sitagliptin 100 mg daily, and empagliflozin 25 mg daily  - Maintain insulin while on stress dose steroids  - Patient does not want to discharge on insulin but will need education about necessity.     # Oral ulcers  - Healed completely on exam today    # Thrombocytopenia  - Likely due to Linezolid  - Monitor closely.   - Improving.   ??? Disposition: Continue inpatient care for complicated infection, debility, need for strengthening and now development of refeeding syndrome with need for critical electrolyte replacements. Medical decision making of high complexity due to need for IV abx, IV phos and IV steroids.     ________________________________________________________________________    Subjective  Dustin Blackwell is a 73 y.o. male. No chest pain. No SOB. No abd pain. No nausea. No vomiting. No headache. NO dizziness. No palpitations. No weakness. No fever. No cough. No sputum.   Reports loose stool every time he eats but otherwise growing stronger. Oral pain resolved.       Medications  Scheduled Meds:allopurinoL (ZYLOPRIM) tablet 300 mg, 300 mg, Oral, QDAY  aspirin EC tablet 81 mg, 81 mg, Oral, QDAY  DAPTOmycin (CUBICIN) injection 500 mg, 8 mg/kg (Adjusted), Intravenous, Q24H*  enoxaparin (LOVENOX) syringe 40 mg, 40 mg, Subcutaneous, QDAY  hydrocortisone PF (Solu-CORTEF) injection 50 mg, 50 mg, Intravenous, BID  insulin aspart U-100 (NOVOLOG FLEXPEN) injection PEN 0-12 Units, 0-12 Units, Subcutaneous, ACHS (22)  insulin NPH (HUMULIN N KwikPen) injection PEN 8 Units, 8 Units, Subcutaneous, BID(8-21)  meropenem (MERREM) 1 g in sodium chloride 0.9% (NS) 100 mL IVPB (MB+), 1 g, Intravenous, Q12H*  pantoprazole DR (PROTONIX) tablet 40 mg, 40 mg, Oral, QDAY  vitamins, multi w/minerals tablet 1 tablet, 1 tablet, Oral, QDAY    Continuous Infusions:  PRN and Respiratory Meds:acetaminophen/lidocaine/antacid DS(#) Q3H PRN, fentaNYL citrate PF Q2H PRN, nystatin QID PRN, polyethylene glycol 3350 QDAY PRN      Objective                       Vital Signs: Last Filed                 Vital Signs: 24 Hour Range   BP: 112/74 (08/29 0548)  Temp: 36.6 ???C (97.8 ???F) (08/29 0548)  Pulse: 70 (08/29 0548)  Respirations: 14 PER MINUTE (08/29 0548)  SpO2: 100 % (08/29 0548) BP: (99-112)/(61-74)   Temp:  [36.5 ???C (97.7 ???F)-36.9 ???C (98.4 ???F)]   Pulse:  [67-81] Respirations:  [14 PER MINUTE-18 PER MINUTE]   SpO2:  [100 %]      Vitals:    06/10/19 0800 06/11/19 1524   Weight: 67.6 kg (149 lb 0.5 oz) 67.6 kg (149 lb)       Intake/Output Summary:  (Last 24 hours)    Intake/Output Summary (Last 24 hours) at 06/16/2019 1229  Last data filed at 06/16/2019 1200  Gross per 24 hour   Intake 1068 ml   Output 0 ml   Net 1068 ml  Stool Occurrence: 1    Physical Exam  General:  Alert, cooperative, no distress, appears stated age  Head:  Normocephalic, atraumatic.   Eyes:  Conjunctivae clear, pupils reactive  Oropharynx: Moist mucus membranes. Aphthous ulcer healed. Poor dentition, multiple broken teeth  Lungs:  Clear to auscultation bilaterally  Heart:   Regular rate and rhythm, S1, S2 normal, no murmur.  Abdomen:  Soft, non-tender.  Bowel sounds normal.  No masses.  No organomegaly.  Extremities: Extremities normal, atraumatic, no cyanosis or edema  Pulses:  2+ and symmetric, all extremities  Neurologic:   CNII - XII intact.  Normal strength, sensation and reflexes throughout.      Lab Review  24-hour labs:    Results for orders placed or performed during the hospital encounter of 06/10/19 (from the past 24 hour(s))   POC GLUCOSE    Collection Time: 06/15/19  6:11 PM   Result Value Ref Range    Glucose, POC 211 (H) 70 - 100 MG/DL   POC GLUCOSE    Collection Time: 06/15/19  9:22 PM   Result Value Ref Range    Glucose, POC 200 (H) 70 - 100 MG/DL   CBC AND DIFF    Collection Time: 06/16/19  4:20 AM   Result Value Ref Range    White Blood Cells 10.1 4.5 - 11.0 K/UL    RBC 3.70 (L) 4.4 - 5.5 M/UL    Hemoglobin 9.2 (L) 13.5 - 16.5 GM/DL    Hematocrit 16.1 (L) 40 - 50 %    MCV 76.1 (L) 80 - 100 FL    MCH 24.9 (L) 26 - 34 PG    MCHC 32.7 32.0 - 36.0 G/DL    RDW 09.6 (H) 11 - 15 %    Platelet Count 71 (L) 150 - 400 K/UL    MPV 9.0 7 - 11 FL    Neutrophils 93 (H) 41 - 77 %    Lymphocytes 5 (L) 24 - 44 %    Monocytes 1 (L) 4 - 12 %    Eosinophils 0 0 - 5 %    Basophils 1 0 - 2 % Absolute Neutrophil Count 9.58 (H) 1.8 - 7.0 K/UL    Absolute Lymph Count 0.47 (L) 1.0 - 4.8 K/UL    Absolute Monocyte Count 0.05 0 - 0.80 K/UL    Absolute Eosinophil Count 0.00 0 - 0.45 K/UL    Absolute Basophil Count 0.05 0 - 0.20 K/UL   COMPREHENSIVE METABOLIC PANEL    Collection Time: 06/16/19  4:20 AM   Result Value Ref Range    Sodium 138 137 - 147 MMOL/L    Potassium 4.2 3.5 - 5.1 MMOL/L    Chloride 112 (H) 98 - 110 MMOL/L    Glucose 155 (H) 70 - 100 MG/DL    Blood Urea Nitrogen 24 7 - 25 MG/DL    Creatinine 0.45 0.4 - 1.24 MG/DL    Calcium 8.0 (L) 8.5 - 10.6 MG/DL    Total Protein 5.9 (L) 6.0 - 8.0 G/DL    Total Bilirubin 1.5 (H) 0.3 - 1.2 MG/DL    Albumin 2.5 (L) 3.5 - 5.0 G/DL    Alk Phosphatase 409 (H) 25 - 110 U/L    AST (SGOT) 69 (H) 7 - 40 U/L    CO2 19 (L) 21 - 30 MMOL/L    ALT (SGPT) 206 (H) 7 - 56 U/L    Anion Gap 7 3 - 12    eGFR Non  African American >60 >60 mL/min    eGFR African American >60 >60 mL/min   MAGNESIUM    Collection Time: 06/16/19  4:20 AM   Result Value Ref Range    Magnesium 1.9 1.6 - 2.6 mg/dL   PHOSPHORUS    Collection Time: 06/16/19  4:20 AM   Result Value Ref Range    Phosphorus 1.5 (L) 2.0 - 4.5 MG/DL   POC GLUCOSE    Collection Time: 06/16/19  7:32 AM   Result Value Ref Range    Glucose, POC 153 (H) 70 - 100 MG/DL   POC GLUCOSE    Collection Time: 06/16/19 12:15 PM   Result Value Ref Range    Glucose, POC 237 (H) 70 - 100 MG/DL        Point of Care Testing  (Last 24 hours)  Glucose: (!) 155 (06/16/19 0420)  POC Glucose (Download): (!) 237 (06/16/19 1215)    Radiology and other Diagnostics Review:    Pertinent radiology reviewed.        Sylvan Cheese, MD, FACP  Team Pager [phone]  Med-private Team M

## 2019-06-16 NOTE — Progress Notes
CLINICAL NUTRITION                                                        Clinical Nutrition Initial Assessment    Name: Dustin Blackwell        MRN: [id number]          DOB: [birth year]          Age: 73 y.o.  Admission Date: 06/10/2019             LOS: 5 days        Recommendation:  ??? Continue lest restrictive diet to help promote PO intake; however, if Pt has post prandial abdominal pain would trial switching to a Low Fat diet.   ??? Recommend offering Boost Glucose Control PRN to help increase PO intake, especially while Pt's mouth is sore.       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 He was scheduled to undergo an ERCP with a biliary stent on 06/10/19, however, due to his low blood pressure the procedure was canceled. On the evening of 06/10/19, a rapid response was called for low blood pressure requiring pressors. He was admitted to the ICU with acute cholangitis, acute pancreatitis and septic shock requiring pressors  s/p ERCP and stenting, throughout his ICU stay patient continued to improve, he was completely weaned off pressors, renal function returned to baseline with creatinine of 1.3 and was transferred to floor status 8/27. Chart reviewed due to RN admission screen for weight loss and decreased appetite. Pt was sleeping soundly at time of RD visit, and he did not wake to his name. Pt is now on a regular diet and eating 75-100% of meals per RN documentation. Per EMR weights, Pt has lost 24 lbs since January, or 19 lbs since  March. This a 14% wt loss in 7 months, or 11% wt loss in 5 months, which is considered a severe wt loss. Pt is noted to have moderate suborbital fat loss, and moderate (maybe severe) muscle wasting at clavicles, deltoids, and intercostals. Pt meets criteria for at least moderate malnutrition and may meet criteria for severe once more information is known. Pt has had decreased PO intake per MD notes related to mouth pain from thrush, unsure how long this has been going on. Per MD notes Pt has reported that he is eating better now, but fears food will fight him again soon. RD was unable to revisit with Pt a second time today, but will follow up on Monday to obtain subjective information. Would offer ONS to help increase PO intake.      Nutrition Assessment of Patient:  Admit Weight: 67.6 kg; Usual Weight: 78.5 kg;    BMI (Calculated): 23.34; BMI Categories Adult: Acceptable: 18.5-24.9; Appearance: Thin    Pertinent Allergies/Intolerances: Strawberry  Pertinent Labs: A1C 6.9, Na 136, CO2 18, BUN 30, BG 227, Phos 1.4, elevated LFTs, POC BG 191-269; Pertinent Meds: MDCFI, NPH, nystatin, miralax, KPhos; Unintentional Weight Loss: > 10% in 6 months (severe)  Oral Diet Order: Regular;  Current Oral Intake: Improving                   Malnutrition Assessment:   Malnutrition present; ICD-10 code E44: Chronic illness/Moderate non-severe malnutrition;  ;  ; Weight loss: 10% x 6 months, Mild loss of body fat, Mild loss of muscle mass;         Nutrition Focused Physical Assessment:   Loss of Subcutaneous Fat: Yes; Severity: Moderate; Location: Orbital  Muscle Wasting: Yes; Severity: Moderate; Location: Clavicle, Deltoid, Temple, Intercostal  Edema: No;  ;       Pressure Injury: none          Nutrition Diagnosis:  Inadequate enteral nutrition infusion  Etiology: related to thrush  Signs & Symptoms: Per EMR review  Unintended weight loss  Etiology: related to pancreatic cancer  Signs & Symptoms: Per EMR weights             Intervention / Plan:  RD to order Boost GC PRN, MVI, and weights  Will monitor PO intake adequacy and tolerance  Will monitor GI function, labs, and weights Goals:  Patient to consume >75% of meals/supplements  Time Frame: Within 72 hours  Prevent further weight loss  Time Frame: Throughout stay           Concepcion Living MS, RD, LD, CNSC  Available on [facility]  Office [phone]

## 2019-06-16 NOTE — Care Plan
Problem: Discharge Planning  Goal: Participation in plan of care  Outcome: Goal Ongoing  Goal: Knowledge regarding plan of care  Outcome: Goal Ongoing  Goal: Prepared for discharge  Outcome: Goal Ongoing     Problem: Infection, Risk of  Goal: Absence of infection  Outcome: Goal Ongoing  Goal: Knowledge of Infection Control Procedures  Outcome: Goal Ongoing     Problem: Falls, High Risk of  Goal: Absence of falls-Adult Patient  Outcome: Goal Ongoing     Problem: Falls, High Risk of  Goal: Absence of Falls-Pediatric patient  Outcome: Goal Ongoing     Problem: Mobility/Activity Intolerance  Goal: Maximize functional ADL's and mobility outcomes  Outcome: Goal Ongoing     Problem: Nutrition Deficit  Goal: Adequate nutritional intake  Outcome: Goal Ongoing

## 2019-06-17 LAB — CULTURE-BLOOD W/SENSITIVITY: Lab: POSITIVE

## 2019-06-17 LAB — POC GLUCOSE
Lab: 280 mg/dL — ABNORMAL HIGH (ref 70–100)
Lab: 228 mg/dL — ABNORMAL HIGH (ref 70–100)
Lab: 326 mg/dL — ABNORMAL HIGH (ref 70–100)
Lab: 227 mg/dL — ABNORMAL HIGH (ref 70–100)

## 2019-06-17 LAB — PHOSPHORUS: Lab: 1.5 mg/dL — ABNORMAL LOW (ref >60)

## 2019-06-17 LAB — COMPREHENSIVE METABOLIC PANEL: Lab: 141 MMOL/L — ABNORMAL LOW (ref 137–147)

## 2019-06-17 LAB — CBC AND DIFF: Lab: 12 K/UL — ABNORMAL HIGH (ref >60)

## 2019-06-17 LAB — MAGNESIUM: Lab: 1.6 mg/dL — ABNORMAL HIGH (ref 1.6–2.6)

## 2019-06-17 MED ORDER — SODIUM PHOSPHATE IVPB
16 MMOL | INTRAVENOUS | 0 refills | Status: DC
Start: 2019-06-17 — End: 2019-06-18

## 2019-06-17 MED ORDER — SODIUM PHOSPHATE IVPB
16 MMOL | INTRAVENOUS | 0 refills | Status: DC
Start: 2019-06-17 — End: 2019-06-19
  Administered 2019-06-18 – 2019-06-19 (×12): 16 mmol via INTRAVENOUS

## 2019-06-17 MED ADMIN — SODIUM CHLORIDE 0.9 % IV SOLP [27838]: 250 mL | INTRAVENOUS | @ 11:00:00 | Stop: 2019-06-17 | NDC 00338004902

## 2019-06-17 NOTE — Care Plan
Problem: Discharge Planning  Goal: Participation in plan of care  Outcome: Goal Ongoing  Goal: Knowledge regarding plan of care  Outcome: Goal Ongoing  Goal: Prepared for discharge  Outcome: Goal Ongoing     Problem: Infection, Risk of  Goal: Absence of infection  Outcome: Goal Ongoing  Goal: Knowledge of Infection Control Procedures  Outcome: Goal Ongoing     Problem: Falls, High Risk of  Goal: Absence of falls-Adult Patient  Outcome: Goal Ongoing  Goal: Absence of Falls-Pediatric patient  Outcome: Goal Ongoing     Problem: Skin Integrity  Goal: Skin integrity intact  Outcome: Goal Ongoing  Goal: Healing of skin (Wound & Incision)  Outcome: Goal Ongoing  Goal: Healing of skin (Pressure Injury)  Outcome: Goal Ongoing     Problem: Infection, Risk of, Central Venous Catheter-Associated Bloodstream Infection  Goal: Absence of CVC Associated Bloodstream infection  Outcome: Goal Ongoing     Problem: Mobility/Activity Intolerance  Goal: Maximize functional ADL's and mobility outcomes  Outcome: Goal Ongoing     Problem: Nutrition Deficit  Goal: Adequate nutritional intake  Outcome: Goal Ongoing

## 2019-06-17 NOTE — Progress Notes
General Progress Note    Name:  Dustin Blackwell   Today's Date:  06/17/2019  Admission Date: 06/10/2019  LOS: 7 days                     Assessment/Plan:      Mr. Coble is a 73yo M with a PMH of???systolic heart failure s/p dual-chamber ICD with recovered EF, COPD, T2DM, hypertension, OSA, former smoker, stage Ia pancreatic adenocarcinoma not on any treatment, ERCP???s/p???biliary drain placement, arthritis and chronic back pain???who was admitted on 06/10/19 via transfer from OSH for worsening RUQ pain. He was scheduled to undergo an ERCP with a biliary stent on 06/10/19, however, due to his low blood pressure the procedure was canceled. On the evening of 06/10/19, a rapid response was called for low blood pressure requiring pressors and he was subsequently transferred to the MICU.  ???  ???  # Acute metabolic encephalopathy  - A&O x 4, due to multiple factors including septic shock, worsening liver/renal function   - Resolved  ???  # Septic Shock  # Chronic Systolic Heart Failure  # HTN  - Pt RR on 06/10/19 PM for hypotension requiring pressor support and subsequent transfer to ICU; given known likely biliary stent failure and RUQ pain, fever, and jaundice this is likely septic shock from acute cholangitis  - Dual chamber ICD in place    - 10/2015 2D echo w/LVEF 20-25%, follows with [facility] cardiology  - 03/2018 2D echo with LVEF 65%  - PTA ASA 81mg , Coreg 25mg  BID, atorvastatin 40mg   - Pt received 3L of IVF during RR + 1 L NS in MICU  - Admission Troponin - 0.49 > 0.36  - 06/11/19 ECHO: EF ~25%  - Weaned off pressors, tapered off hydrocortisone stress dose, last dose 06/16/19.   - Hold PTA Coreg; hold atorvastatin  - Continue PTA ASA 81mg   - Repeat 2D echo in 4 weeks when recovered.   ???   # COPD  - 8/24 CXR; Mostly unremarkable; trace increased interstitial markings  - No PTA inhalers  - SpO2 > 92% 2L o/n    # Acute cholangitis (rule out)  # Acute pancreatitis  # Elevated liver enzymes - Hx of bilary stent placement 2/2 pancreatic cancer  - Presented to OSH w/ RUQ pain - OSH CT w/ pneumobilia concerning for stent obstruction  - New stent placed per GI on 8/24   - Improving cholestatic hepatitis  - Meropenem changed to Ertapenem 8/28. Continue daptomycin (8/27)  - Will place PICC for discharge, ordered for tomorrow.      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    # Hypophosphatemia, hypomagnesemia  - Replace aggressively and monitor   - Likely has developed refeeding syndrome and will need critical replacements  - Replaced more today  ???  # Enterococcus bacteremia  # Elevated Procalcitonin  # Acute Cholangitis  - BC + smear for gram+ cocci resembling streptococc. Culture w enterococcus casselifllavus and faecalis 8/23  - Procalcitonin > 100,000  - Repeat BC drawn- Enterococcus faecalis- 8/24  - TTE showed no vegetations on ICD leads, aortic and pulmonary valves poorly visualized 8/24  - BC ngtd (3 days)  - WBC better 8/29, now back to 12.7  - Alk phos elevated today, will monitor closely today.   ???  # Stage 1A pancreatic cancer  -  Follows with Dr. Tamsen Meek at [facility]  - Pt has elected to not undergo any chemotherapy or surgery, however, has also declined hospice  - OSH CT scan prior to transfer???reportedly showing a new mass in the pelvis concerning for metastasis, pneumobilia and mild ductal dilatation  - 12/2018 CA 19-9 774  - He is likely understaged at this time given his new scan, stent failure  - Oncology consulted.      # T2DM  - Last A1c 10.4 on 05/2017, updated to 6.9 this admission  - PTA metformin 1000mg  BID, sitagliptin 100 mg daily, and empagliflozin 25 mg daily  - Maintain insulin and monitor need now that off steroids, as HbA1C was at goal on admission  - Patient does not want to discharge on insulin.     # Oral ulcers  - Healed completely on exam  - Nystatin stopped    # Thrombocytopenia - Likely due to Linezolid  - Monitor closely.   - Improving.   ???  Disposition: Continue inpatient care for complicated infection, debility, need for strengthening and now development of refeeding syndrome with need for critical electrolyte replacements. Medical decision making of high complexity due to need for IV abx, IV phos.     ________________________________________________________________________    Subjective  Dustin Blackwell is a 73 y.o. male.   No chest pain. No SOB. No abd pain. No nausea. No vomiting. No headache. NO dizziness. No palpitations. No weakness. No fever. No cough. No sputum.   Oral sensation in mouth makes it hard to swallow. Told him to avoid mouth rinse prior to eating. Daughter at bedside, updated on plan of care.     Medications  Scheduled Meds:allopurinoL (ZYLOPRIM) tablet 300 mg, 300 mg, Oral, QDAY  aspirin EC tablet 81 mg, 81 mg, Oral, QDAY  DAPTOmycin (CUBICIN) injection 500 mg, 8 mg/kg (Adjusted), Intravenous, Q24H*  enoxaparin (LOVENOX) syringe 40 mg, 40 mg, Subcutaneous, QDAY  insulin aspart U-100 (NOVOLOG FLEXPEN) injection PEN 0-12 Units, 0-12 Units, Subcutaneous, ACHS (22)  insulin NPH (HUMULIN N KwikPen) injection PEN 8 Units, 8 Units, Subcutaneous, BID(8-21)  meropenem (MERREM) 1 g in sodium chloride 0.9% (NS) 100 mL IVPB (MB+), 1 g, Intravenous, Q12H*  pantoprazole DR (PROTONIX) tablet 40 mg, 40 mg, Oral, QDAY  vitamins, multi w/minerals tablet 1 tablet, 1 tablet, Oral, QDAY    Continuous Infusions:  PRN and Respiratory Meds:acetaminophen/lidocaine/antacid DS(#) Q3H PRN, fentaNYL citrate PF Q2H PRN, nystatin QID PRN, polyethylene glycol 3350 QDAY PRN      Objective                       Vital Signs: Last Filed                 Vital Signs: 24 Hour Range   BP: 112/63 (08/30 0442)  Temp: 37 ???C (98.6 ???F) (08/30 0442)  Pulse: 66 (08/30 0442)  Respirations: 14 PER MINUTE (08/30 0442)  SpO2: 100 % (08/30 0442) BP: (98-112)/(59-70)   Temp:  [36.6 ???C (97.9 ???F)-37 ???C (98.6 ???F)] Pulse:  [66-83]   Respirations:  [14 PER MINUTE-16 PER MINUTE]   SpO2:  [98 %-100 %]      Vitals:    06/10/19 0800 06/11/19 1524   Weight: 67.6 kg (149 lb 0.5 oz) 67.6 kg (149 lb)       Intake/Output Summary:  (Last 24 hours)    Intake/Output Summary (Last 24 hours) at 06/17/2019 0806  Last data filed at 06/17/2019 1610  Gross  per 24 hour   Intake 1042 ml   Output 725 ml   Net 317 ml      Stool Occurrence: 1    Physical Exam  General:  Alert, cooperative, no distress, appears stated age  Head:  Normocephalic, atraumatic.   Eyes:  Conjunctivae clear, pupils reactive  Oropharynx: Moist mucus membranes. Aphthous ulcer healed. Poor dentition, multiple broken teeth  Lungs:  Clear to auscultation bilaterally  Heart:   Regular rate and rhythm, S1, S2 normal, no murmur.  Abdomen:  Soft, non-tender.  Bowel sounds normal.  No masses.  No organomegaly.  Extremities: Extremities normal, atraumatic, no cyanosis or edema  Pulses:  2+ and symmetric, all extremities  Neurologic:   CNII - XII intact.  Normal strength, sensation and reflexes throughout.      Lab Review  24-hour labs:    Results for orders placed or performed during the hospital encounter of 06/10/19 (from the past 24 hour(s))   POC GLUCOSE    Collection Time: 06/16/19 12:15 PM   Result Value Ref Range    Glucose, POC 237 (H) 70 - 100 MG/DL   POC GLUCOSE    Collection Time: 06/16/19  5:12 PM   Result Value Ref Range    Glucose, POC 317 (H) 70 - 100 MG/DL   POC GLUCOSE    Collection Time: 06/16/19  9:18 PM   Result Value Ref Range    Glucose, POC 280 (H) 70 - 100 MG/DL   CBC AND DIFF    Collection Time: 06/17/19  4:29 AM   Result Value Ref Range    White Blood Cells 12.7 (H) 4.5 - 11.0 K/UL    RBC 3.65 (L) 4.4 - 5.5 M/UL    Hemoglobin 8.9 (L) 13.5 - 16.5 GM/DL    Hematocrit 96.0 (L) 40 - 50 %    MCV 78.1 (L) 80 - 100 FL    MCH 24.5 (L) 26 - 34 PG    MCHC 31.4 (L) 32.0 - 36.0 G/DL    RDW 45.4 (H) 11 - 15 %    Platelet Count 96 (L) 150 - 400 K/UL    MPV 9.3 7 - 11 FL COMPREHENSIVE METABOLIC PANEL    Collection Time: 06/17/19  4:29 AM   Result Value Ref Range    Sodium 141 137 - 147 MMOL/L    Potassium 4.1 3.5 - 5.1 MMOL/L    Chloride 112 (H) 98 - 110 MMOL/L    Glucose 167 (H) 70 - 100 MG/DL    Blood Urea Nitrogen 19 7 - 25 MG/DL    Creatinine 0.98 0.4 - 1.24 MG/DL    Calcium 8.0 (L) 8.5 - 10.6 MG/DL    Total Protein 5.7 (L) 6.0 - 8.0 G/DL    Total Bilirubin 1.4 (H) 0.3 - 1.2 MG/DL    Albumin 2.3 (L) 3.5 - 5.0 G/DL    Alk Phosphatase 119 (H) 25 - 110 U/L    AST (SGOT) 47 (H) 7 - 40 U/L    CO2 21 21 - 30 MMOL/L    ALT (SGPT) 153 (H) 7 - 56 U/L    Anion Gap 8 3 - 12    eGFR Non African American >60 >60 mL/min    eGFR African American >60 >60 mL/min   MAGNESIUM    Collection Time: 06/17/19  4:29 AM   Result Value Ref Range    Magnesium 1.6 1.6 - 2.6 mg/dL   PHOSPHORUS    Collection Time: 06/17/19  4:29 AM   Result Value Ref Range    Phosphorus 1.5 (L) 2.0 - 4.5 MG/DL        Point of Care Testing  (Last 24 hours)  Glucose: (!) 167 (06/17/19 0429)  POC Glucose (Download): (!) 280 (06/16/19 2118)    Radiology and other Diagnostics Review:    Pertinent radiology reviewed.        Sylvan Cheese, MD, FACP  Team Pager [phone]  Med-private Team M

## 2019-06-18 ENCOUNTER — Encounter: Admit: 2019-06-18 | Discharge: 2019-06-18

## 2019-06-18 LAB — CULTURE-BLOOD W/SENSITIVITY

## 2019-06-18 LAB — PHOSPHORUS: Lab: 1.7 mg/dL — ABNORMAL LOW (ref 2.0–4.5)

## 2019-06-18 LAB — CREATINE KINASE-CPK: Lab: 85 U/L (ref 35–232)

## 2019-06-18 LAB — CA19.9: Lab: >17 U/mL — ABNORMAL HIGH (ref <35)

## 2019-06-18 LAB — POC GLUCOSE
Lab: 260 mg/dL — ABNORMAL HIGH (ref 70–100)
Lab: 136 mg/dL — ABNORMAL HIGH (ref 70–100)
Lab: 217 mg/dL — ABNORMAL HIGH (ref 70–100)
Lab: 174 mg/dL — ABNORMAL HIGH (ref 70–100)

## 2019-06-18 LAB — CBC AND DIFF: Lab: 13 10*3/uL — ABNORMAL HIGH (ref 4.5–11.0)

## 2019-06-18 LAB — COMPREHENSIVE METABOLIC PANEL: Lab: 140 MMOL/L — ABNORMAL LOW (ref 137–147)

## 2019-06-18 LAB — MAGNESIUM: Lab: 1.4 mg/dL — ABNORMAL LOW (ref 1.6–2.6)

## 2019-06-18 MED ORDER — MAGNESIUM SULFATE IN D5W 1 GRAM/100 ML IV PGBK
1 g | Freq: Once | INTRAVENOUS | 0 refills | Status: DC
Start: 2019-06-18 — End: 2019-06-18

## 2019-06-18 MED ORDER — ACETAMINOPHEN 500 MG PO TAB
500 mg | ORAL | 0 refills | Status: DC | PRN
Start: 2019-06-18 — End: 2019-06-20

## 2019-06-18 MED ORDER — THIAMINE HCL (VITAMIN B1) 100 MG/ML IJ SOLN
100 mg | Freq: Every day | INTRAVENOUS | 0 refills | Status: DC
Start: 2019-06-18 — End: 2019-06-20
  Administered 2019-06-18: 23:00:00 100 mg via INTRAVENOUS

## 2019-06-18 MED ORDER — MAGNESIUM SULFATE IN D5W 1 GRAM/100 ML IV PGBK
1 g | INTRAVENOUS | 0 refills | Status: CP
Start: 2019-06-18 — End: ?
  Administered 2019-06-18 (×2): 1 g via INTRAVENOUS

## 2019-06-18 MED ORDER — SODIUM CHLORIDE 0.9 % FLUSH
5-10 mL | Freq: Three times a day (TID) | 0 refills | Status: DC
Start: 2019-06-18 — End: 2019-06-20

## 2019-06-18 MED ORDER — OXYCODONE 5 MG PO TAB
5 mg | ORAL | 0 refills | Status: DC | PRN
Start: 2019-06-18 — End: 2019-06-20
  Administered 2019-06-19 – 2019-06-20 (×3): 5 mg via ORAL

## 2019-06-18 MED ORDER — ACETAMINOPHEN 325 MG PO TAB
650 mg | Freq: Once | ORAL | 0 refills | Status: CP
Start: 2019-06-18 — End: ?
  Administered 2019-06-18: 11:00:00 650 mg via ORAL

## 2019-06-18 MED ADMIN — SODIUM CHLORIDE 0.9 % IV SOLP [27838]: 250 mL | INTRAVENOUS | @ 06:00:00 | Stop: 2019-06-18 | NDC 00338004902

## 2019-06-18 NOTE — Progress Notes
PHYSICAL THERAPY  PROGRESS NOTE        Name: Dustin Blackwell        MRN: [id number]          DOB: 30-Jun-1946          Age: 73 y.o.  Admission Date: 06/10/2019             LOS: 8 days        Mobility  Patient Turn/Position: Supine  Progressive Mobility Level: Walk in room  Distance Walked (feet): 12 ft(x2 bouts - to/from restroom only per pt request)  Level of Assistance: Stand by assistance  Assistive Device: Walker  Time Tolerated: 11-30 minutes  Activity Limited By: Patient request to stop    Subjective  Significant hospital events: PMH of systolic heart failure s/p dual-chamber ICD with recovered EF, COPD, T2DM, hypertension, OSA, former smoker, stage Ia pancreatic adenocarcinoma not on any treatment, ERCP s/p biliary drain placement, arthritis and chronic back pain who was admitted on 06/10/19 via transfer from OSH for worsening RUQ pain. He was scheduled to undergo an ERCP with a biliary stent on 06/10/19, however, due to his low blood pressure the procedure was canceled. On the evening of 06/10/19, a rapid response was called for low blood pressure requiring pressors and he was subsequently transferred to the MICU. Off levo 8/27.  Mental / Cognitive Status: Alert;Oriented;Cooperative;Follows Commands  Persons Present: (cousin)  Pain: Patient complains of pain;Patient does not rate pain  Pain Location: Abdomen  Pain Interventions: Patient agrees to participate in therapy;Treatment altered to patient's pain tolerance;Patient assisted into position of comfort  Ambulation Assist: Independent Mobility in Community without Device  Patient Owned Equipment: Water engineer doesn't know where it is )  Home Situation: Lives Alone  Type of Home: House  Entry Stairs: 3-5 Stairs;Rail on 1 Side  In-Home Stairs: No Stairs    Bed Mobility/Transfer  Bed Mobility: Supine to Sit: Modified Independent;Head of Bed Elevated  Bed Mobility: Sit to Supine: Modified Independent;HOB Elevated  Transfer Type: Sit to/from Stand Transfer: Assistance Level: To/From;Bed;Toilet;Standby Assist  Transfer: Assistive Device: Nurse, adult  Transfers: Type Of Assistance: For Balance;For Strength Deficit;For Safety Considerations;Requires Extra Time  End Of Activity Status: In Bed;Nursing Notified;Instructed Patient to Request Assist with Mobility;Instructed Patient to Use Call Light    Gait  Gait Distance: 12 feet(x2 (to/from bathroom - pt declined further distance))  Gait: Assistance Level: Standby Assist  Gait: Assistive Device: Nurse, adult  Gait: Descriptors: Pace: Slow;Forward trunk flexion;Decreased step length;No balance loss    Assessment/Progress  Impaired Mobility Due To: Decreased Strength;Deconditioning;Pain    AM-PAC 6 Clicks Basic Mobility Inpatient  Turning from your back to your side while in a flat bed without using bed rails: None  Moving from lying on your back to sitting on the side of a flatbed without using bedrails : None  Moving to and from a bed to a chair (including a wheelchair): A Little  Standing up from a chair using your arms (e.g. wheelchair, or bedside chair): None  To walk in hospital room: A Little  Climbing 3-5 steps with a railing: A Little  Raw Score: 21  Standardized (T-scale) Score: 45.55  Basic Mobility CMS 0-100%: 29.52  CMS G Code Modifier for Basic Mobility: CJ    Goals  Goal Formulation: With Patient  Time For Goal Achievement: 5 days  Patient Will Go Supine To/From Sit: w/ Stand By Assist  Patient Will Transfer Sit to Stand: w/ Stand By Assist  Patient Will Ambulate: 101-150 Feet, w/ No Device, w/ Stand By Assist  Patient Will Go Up / Down Stairs: 3-5 Stairs, w/ Stand By Assist    Plan  Treatment Interventions: Mobility Training  Plan Frequency: 3-5 Days per Week  PT Plan for Next Visit: Progress bed mobility with bed flat, increased ambulation distance, trial stairs    PT Discharge Recommendations  Recommendation: Home with intermittent supervision/assistance Recommendation for Therapy Post Discharge: Home health  Patient Currently Requires Equipment: Walker with wheels.  Patient requires the use of a walker with wheels to complete ADL???s in the home including meal preparation, ambulation the bathroom for toileting, bathing and grooming, and safe home mobility.  Patient is unable to complete these ADL???s with a cane or crutch and can safely use the walker.      Therapist: London Pepper  Date: 06/18/2019

## 2019-06-18 NOTE — Progress Notes
Oncology Consult Progress Note    Name:  Dustin Blackwell   ZOXWR'U Date:  06/18/2019  Admission Date: 06/10/2019  LOS: 8 days                     Assessment/Plan:    Principal Problem:    Abdominal pain  Active Problems:    Acute cholangitis    Septic shock due to undetermined organism [facility])    Cardiogenic shock (HCC)    AKI (acute kidney injury) (HCC)    Lactic acidosis    Moderate malnutrition (HCC)    Pancreas Cancer  - Dec 2019: Dx with stage I disease after presenting with abdominal pain  - Declined any treatment  - CA19-9 most recently in March 2020 was 774 (from 1,152 in December and 376 in January  - 06/10/2019: OSH imaging 4.7 cm pelvic mass concerning for metastatic disease.    Biliary Obstruction   Sepsis  - New stent placed per GI on 8/24   - Improving cholestatic hepatitis  - ID, GI following     Plan  - Patient undecided about pursuing chemotherapy. Given stage IV disease, treatment would not be curative, but given with goal of controlling present disease  - Patient would like Dr. Tamsen Meek to discuss with his daughter Tresa Endo  - Will arrange outpatient follow up appointment with Dr. Tamsen Meek if patient elects to pursue treatment.    Patient seen and discussed with Dr. Tamsen Meek  ________________________________________________________________________    Subjective  Dustin Blackwell is a 73 y.o. male.  No acute events overnight. Patient fatigued, denies any current pain.    Medications  Scheduled Meds:allopurinoL (ZYLOPRIM) tablet 300 mg, 300 mg, Oral, QDAY  aspirin EC tablet 81 mg, 81 mg, Oral, QDAY  DAPTOmycin (CUBICIN) injection 500 mg, 8 mg/kg (Adjusted), Intravenous, Q24H*  enoxaparin (LOVENOX) syringe 40 mg, 40 mg, Subcutaneous, QDAY  insulin aspart U-100 (NOVOLOG FLEXPEN) injection PEN 0-12 Units, 0-12 Units, Subcutaneous, ACHS (22)  insulin NPH (HUMULIN N KwikPen) injection PEN 8 Units, 8 Units, Subcutaneous, BID(8-21) meropenem (MERREM) 1 g in sodium chloride 0.9% (NS) 100 mL IVPB (MB+), 1 g, Intravenous, Q12H*  pantoprazole DR (PROTONIX) tablet 40 mg, 40 mg, Oral, QDAY  sodium phosphate 16 mmol in dextrose 5% (D5W) 250 mL IVPB, 16 mmol, Intravenous, Q6H  vitamins, multi w/minerals tablet 1 tablet, 1 tablet, Oral, QDAY    Continuous Infusions:  PRN and Respiratory Meds:acetaminophen Q6H PRN, acetaminophen/lidocaine/antacid DS(#) Q3H PRN, oxyCODONE Q8H PRN, polyethylene glycol 3350 QDAY PRN      Objective                       Vital Signs: Last Filed                 Vital Signs: 24 Hour Range   BP: 125/75 (08/31 0600)  Temp: 37.1 ???C (98.8 ???F) (08/31 0600)  Pulse: 81 (08/31 0600)  Respirations: 18 PER MINUTE (08/31 0600)  SpO2: 99 % (08/31 0600) BP: (105-125)/(60-75)   Temp:  [36.7 ???C (98 ???F)-37.1 ???C (98.8 ???F)]   Pulse:  [78-93]   Respirations:  [18 PER MINUTE-20 PER MINUTE]   SpO2:  [99 %-100 %]      Vitals:    06/10/19 0800 06/11/19 1524   Weight: 67.6 kg (149 lb 0.5 oz) 67.6 kg (149 lb)       Intake/Output Summary:  (Last 24 hours)    Intake/Output Summary (Last 24 hours) at 06/18/2019 1110  Last data filed at 06/17/2019 1719  Gross per 24 hour   Intake ???   Output 400 ml   Net -400 ml      Stool Occurrence: 1    Review of Systems:  A 14 point review of systems was negative except for: Constitutional: positive for fatigue    Physical Exam  General appearance: Alert, cooperative, no distress  Head: Normocephalic,  atraumatic  Lungs: Non-labored  Neurologic: No focal deficits    Lab Review  24-hour labs:    Results for orders placed or performed during the hospital encounter of 06/10/19 (from the past 24 hour(s))   POC GLUCOSE    Collection Time: 06/17/19 12:57 PM   Result Value Ref Range    Glucose, POC 326 (H) 70 - 100 MG/DL   POC GLUCOSE    Collection Time: 06/17/19  5:22 PM   Result Value Ref Range    Glucose, POC 227 (H) 70 - 100 MG/DL   POC GLUCOSE    Collection Time: 06/17/19  9:24 PM   Result Value Ref Range Glucose, POC 260 (H) 70 - 100 MG/DL   CBC AND DIFF    Collection Time: 06/18/19  4:11 AM   Result Value Ref Range    White Blood Cells 13.0 (H) 4.5 - 11.0 K/UL    RBC 3.46 (L) 4.4 - 5.5 M/UL    Hemoglobin 8.6 (L) 13.5 - 16.5 GM/DL    Hematocrit 16.1 (L) 40 - 50 %    MCV 76.1 (L) 80 - 100 FL    MCH 24.8 (L) 26 - 34 PG    MCHC 32.6 32.0 - 36.0 G/DL    RDW 09.6 (H) 11 - 15 %    Platelet Count 130 (L) 150 - 400 K/UL    MPV 8.4 7 - 11 FL    Neutrophils 86 (H) 41 - 77 %    Lymphocytes 9 (L) 24 - 44 %    Monocytes 3 (L) 4 - 12 %    Eosinophils 1 0 - 5 %    Basophils 1 0 - 2 %    Absolute Neutrophil Count 11.25 (H) 1.8 - 7.0 K/UL    Absolute Lymph Count 1.20 1.0 - 4.8 K/UL    Absolute Monocyte Count 0.40 0 - 0.80 K/UL    Absolute Eosinophil Count 0.06 0 - 0.45 K/UL    Absolute Basophil Count 0.08 0 - 0.20 K/UL   COMPREHENSIVE METABOLIC PANEL    Collection Time: 06/18/19  4:11 AM   Result Value Ref Range    Sodium 140 137 - 147 MMOL/L    Potassium 3.5 3.5 - 5.1 MMOL/L    Chloride 111 (H) 98 - 110 MMOL/L    Glucose 109 (H) 70 - 100 MG/DL    Blood Urea Nitrogen 16 7 - 25 MG/DL    Creatinine 0.45 0.4 - 1.24 MG/DL    Calcium 7.7 (L) 8.5 - 10.6 MG/DL    Total Protein 5.6 (L) 6.0 - 8.0 G/DL    Total Bilirubin 1.3 (H) 0.3 - 1.2 MG/DL    Albumin 2.3 (L) 3.5 - 5.0 G/DL    Alk Phosphatase 409 (H) 25 - 110 U/L    AST (SGOT) 58 (H) 7 - 40 U/L    CO2 23 21 - 30 MMOL/L    ALT (SGPT) 131 (H) 7 - 56 U/L    Anion Gap 6 3 - 12    eGFR Non African American >60 >60 mL/min  eGFR African American >60 >60 mL/min   MAGNESIUM    Collection Time: 06/18/19  4:11 AM   Result Value Ref Range    Magnesium 1.4 (L) 1.6 - 2.6 mg/dL   PHOSPHORUS    Collection Time: 06/18/19  4:11 AM   Result Value Ref Range    Phosphorus 1.7 (L) 2.0 - 4.5 MG/DL   IR48.5    Collection Time: 06/18/19  4:11 AM   Result Value Ref Range    CA 19-9 >17000 (H) <35 U/ml   CREATINE KINASE-CPK    Collection Time: 06/18/19  4:11 AM   Result Value Ref Range Creatine Kinase 85 35 - 232 U/L   POC GLUCOSE    Collection Time: 06/18/19  7:56 AM   Result Value Ref Range    Glucose, POC 136 (H) 70 - 100 MG/DL       Point of Care Testing  (Last 24 hours)  Glucose: (!) 109 (06/18/19 0411)  POC Glucose (Download): (!) 136 (06/18/19 0756)    Radiology and other Diagnostics Review:    Pertinent radiology reviewed.    Loraine Grip, APRN  [phone]

## 2019-06-18 NOTE — Progress Notes
PHYSICAL THERAPY  MOBILITY NOTE      Name: Dustin Blackwell        MRN: [id number]          DOB: 26-Apr-1946          Age: 73 y.o.  Admission Date: 06/10/2019             LOS: 8 days      Patient was assigned for activity with the mobility aide by the supervising therapist.  Patient declined to participate despite encouragement. Pt recently ambulated to bathroom with PT and is wishing to rest at this time.       Rehab Technician: Jeoffrey Massed  Date: 06/18/2019

## 2019-06-18 NOTE — Progress Notes
CLINICAL NUTRITION                                                        Clinical Nutrition Follow-Up Assessment     Name: Dustin Blackwell        MRN: [id number]          DOB: 12-21-1945          Age: 74 y.o.  Admission Date: 06/10/2019             LOS: 8 days        Recommendation:  ??? Recommend referral to outpatient RD at cancer center for continued follow up.   ??? Due to possible refeeding syndrome, recommend 100 mg Thiamine IV x 3 days, then 100 mg Thiamine PO for a total of 1 week.   ??? Continue least restrictive diet to help promote PO intake; however, if Pt has post prandial abdominal pain would trial switching to a Low Fat diet.   ??? Recommend offering Boost Glucose Control PRN to help increase PO intake, especially while Pt's mouth is sore.    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 He was scheduled to undergo an ERCP with a biliary stent on 06/10/19, however, due to his low blood pressure the procedure was canceled. On the evening of 06/10/19, a rapid response was called for low blood pressure requiring pressors. He was admitted to the ICU with acute cholangitis, acute pancreatitis and septic shock requiring pressors s/p ERCP and stenting. Pt transferred to floor status 8/27. Chart reviewed due to RN admission screen for weight loss and decreased appetite. Pt was awake at time of RD visit today, family was also present in person and via phone. Pt reports that he has been eating like a bird for months. He reports only eating enough PTA to tolerate taken his meds in the AM and PM. Per EMR weights, Pt has lost 24 lbs since January or 19 lbs since March. This a 14% wt loss in 7 months or 11% wt loss in 5 months, which is considered a severe wt loss. Pt is noted to have moderate suborbital fat loss, and moderate (maybe severe) muscle wasting at clavicles, deltoids, and intercostals. Pt meets criteria for severe chronic malnutrition. Pt has had decreased PO intake per MD notes related to mouth pain from thrush, which is improving. Pt reports he is now cleaning his plate, eating 81-191% per RN documentation, yet Pt denies ordering a full three meals per day. He reports having oatmeal at breakfast and salmon at lunch today. Pt is noted to be showing signs of refeeding syndrome, requiring extensive electrolyte replacement, especially phosphorus. RD discussed starting IV thiamine today with Dr Myrtis Ser. Pt and family with questions about best diet choices. RD encouraged avoiding high fat foods due to pancreatic and biliary issues. Also recommended small frequent meals, eating every 3-4 hours, and using ONS to help increase PO intake.  Provided handouts on low fat diet from Oncology tool kit, along with soft/moist protein ideas, and list of OTC  oral supplements.                   Nutrition Assessment of Patient:  Admit Weight: 67.6 kg; Weight Change Since Admit: no new wts  BMI (Calculated): 23.34; BMI Categories Adult: Acceptable: 18.5-24.9     Pertinent Allergies/Intolerances: lactose intolerant, strawberry    Pertinent Labs: K 3.5, Mg 1.4, Phos 1.7; Pertinent Meds: GI cocktail, MDCFI, NPH, Na Phos, MVI, Mg Sulf; Unintentional Weight Loss: > 10% in 6 months (severe)  Oral Diet Order: Regular; Oral Supplement: Boost glucose control                    Estimated Calorie Needs: [phone] kcals/kg per 67.6 kg)  Estimated Protein Needs: 81-101(1.2-1.5 gm/kg per 67.6 kg)    Malnutrition Assessment:  Malnutrition present; ICD-10 code E43: Chronic illness/Severe malnutrition;  ;  ; ( ); Energy intake: 75% or less of estimated energy requirement for 1 month or more, Weight loss: >10% x 6 months Malnutrition Interventions: Provided diet education to Pt and family    Nutrition Focused Physical Assessment:  Loss of Subcutaneous Fat: Yes; Severity: Moderate; Location: Orbital  Muscle Wasting: Yes; Severity: Moderate; Location: Clavicle, Deltoid, Temple, Intercostal  Edema: No;  ;       Pressure Injury: none          Nutrition Diagnosis:  Inadequate oral intake  Etiology: related to thrush, decreased appetite and abdominal pain  Signs & Symptoms: Per Pt reports, and recorded wt loss  Unintended weight loss  Etiology: related to pancreatic cancer  Signs & Symptoms: Per EMR weights             Intervention / Plan:  Will monitor PO intake adequacy and tolerance  Will monitor GI function, labs, and weights       Goals:  Patient to consume >75% of meals/supplements  Time Frame: Within 72 hours  Status: Partially met;Ongoing  Prevent further weight loss  Time Frame: Throughout stay  Status: Ongoing        Concepcion Living MS, RD, LD, CNSC  Available on [employer] and [employer]  Office [phone]

## 2019-06-18 NOTE — Procedures
PICC Line Insertion Procedure Note    NAME:Kato Jumal Hussain                                             MRN: [id number]                 DOB:September 25, 1946          AGE: 73 y.o.  ADMISSION DATE: 06/10/2019             DAYS ADMITTED: LOS: 8 days      Procedure Details: Informed consent was obtained for the procedure.  Risks of infection, blood clot, and nerve or vessel damage were discussed.     Indications: Home IV therapy     Procedure: Under sterile conditions the skin at the insertion site was prepped with chlorhexadineand covered with a sterile drape. Local anesthesia was applied to the skin and subcutaneous tissues.  A #4 FR, Single Lumen, PICC was inserted in theRight Basilic vein per hospital protocol. Blood return:  Yes Catheter trimmed@43cm , inserted to 43 cm, with 0 cm external.  Catheter was flushed with 30 mL NS. Patient did tolerate procedure well. Mid upper arm circumference is 31.5cm.Vein size 6.50mm 19%    Verification:Placement confirmed with ECG., Patency verified by positive blood return., Venous location confirmed by ultrasound., Surveyor, quantity given to patient and/or left at bedside. and Picc confirmation CAJ per green diamond and per green 3CG   In the physical presence of L. Trotman, RN, I have taken down these notes, Tedd Sias. 06/18/2019

## 2019-06-18 NOTE — Progress Notes
Home Infusion Benefits    Daptomycin 500mg  Q24    Patient has medicare part d for medication coverage.     Patient has [place] for supply coverage.    Total estimated expense for 7 days of therapy: $3.60

## 2019-06-18 NOTE — Progress Notes
General Progress Note    Name:  Dustin Blackwell   Today's Date:  06/18/2019  Admission Date: 06/10/2019  LOS: 8 days                     Assessment/Plan:      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 He was scheduled to undergo an ERCP with a biliary stent on 06/10/19, however, due to his low blood pressure the procedure was canceled. On the evening of 06/10/19, a rapid response was called for low blood pressure requiring pressors and he was subsequently transferred to the MICU.  ???  ???  Acute metabolic encephalopathy  - A&O x 4, due to multiple factors including septic shock, worsening liver/renal function   - Resolved  ???  Septic shock  Chronic systolic HFrEF  HTN  - Pt RR on 06/10/19 PM for hypotension requiring pressor support and subsequent transfer to ICU; given known likely biliary stent failure and RUQ pain, fever, and jaundice this is likely septic shock from acute cholangitis  - Dual chamber ICD in place    - 10/2015 2D echo w/LVEF 20-25%, follows with [facility] cardiology  - 03/2018 2D echo with LVEF 65%  - PTA ASA 81mg , Coreg 25mg  BID, atorvastatin 40mg   - Pt received 3L of IVF during RR + 1 L NS in MICU  - Admission Troponin - 0.49 > 0.36  - 06/11/19 ECHO: EF ~25%  - Weaned off pressors, tapered off hydrocortisone stress dose, last dose 06/16/19.   - Hold PTA Coreg; hold atorvastatin  - Continue PTA ASA 81mg   - Repeat 2D echo in 4 weeks when recovered.   ???   COPD  - 8/24 CXR; Mostly unremarkable; trace increased interstitial markings  - No PTA inhalers  - SpO2 > 92% 2L o/n    Acute pancreatitis  Elevated liver enzymes  Acute cholangitis   - Hx of bilary stent placement 2/2 pancreatic cancer - Presented to OSH w/ RUQ pain - OSH CT w/ pneumobilia concerning for stent obstruction  - New stent placed per GI on 8/24   - Tbili stable but rising Alk phos, will monitor and discus with GI is continues to worsen    AKI on CKD stage 3  AGMA  Lactic acidosis  - Likely from sepsis  - Resolved???    Hypophosphatemia  Hypomagnesemia  - Replace aggressively and monitor   - Likely has developed refeeding syndrome and will need critical replacements  - Replaced more today  ???  Enterococcus bacteremia  Acute cholangitis  - BC + smear for gram+ cocci resembling streptococc. Culture w enterococcus casselifllavus and faecalis 8/23  - Procalcitonin > 100,000  - Repeat BC drawn- Enterococcus faecalis- 8/24  - TTE showed no vegetations on ICD leads, aortic and pulmonary valves poorly visualized 8/24  - BC ngtd (3 days)  - Slight in crease in WBC count today, monitor  - Meropenem changed to Ertapenem 8/28. Last day of Erta today.  - Continue Daptomycin 8mg /kg IV q 24hr. Plan to complete 14d from 8/25 (stop date) 06/25/19  - Single lumen PICC placed 8/31   - Weekly CBC w/ diff, CMP and CPK faxed to ID clinic   ???  Stage 1A pancreatic cancer  - Follows with Dr. Tamsen Meek at [facility]  - Pt has elected to not undergo any chemotherapy or surgery, however, has also declined hospice  - OSH CT scan prior to transfer???reportedly showing a new mass in the pelvis concerning for metastasis, pneumobilia and mild ductal dilatation  - 12/2018 CA 19-9 [id number]  - He is likely understaged at this time given his new scan, stent failure  - Oncology following - patient has not decided on pursuing chemo. He will further discuss outpatient with his primary Oncologist.       T2DM  - Last A1c 10.4 on 05/2017, updated to 6.9 this admission  - PTA metformin 1000mg  BID, sitagliptin 100 mg daily, and empagliflozin 25 mg daily  - Maintain insulin and monitor need now that off steroids, as HbA1C was at goal on admission  - Patient does not want to discharge on insulin. Oral ulcers  - Healed completely on exam  - Nystatin stopped    Thrombocytopenia  - Likely due to Linezolid  - Monitor closely.   - Improving.   ???  Diet: Regular  DVT ppx: Lovenox  DNAR-FI  Dispo: Continue inpatient care for complicated infection, debility, development of refeeding syndrome with need for critical electrolyte replacements. Medical decision making of high complexity due to need for IV abx, IV phos.   ________________________________________________________________________    Subjective  No events reported overnight. Patient's cousin at beside. The patient notes that he is feeling tired but otherwise feels fine. He denies f/c, CP, SOA, n/v. He was having some abdominal pain earlier but this has since resolved. He otherwise denies any complaints or concerns.     Medications  Scheduled Meds:allopurinoL (ZYLOPRIM) tablet 300 mg, 300 mg, Oral, QDAY  aspirin EC tablet 81 mg, 81 mg, Oral, QDAY  DAPTOmycin (CUBICIN) injection 500 mg, 8 mg/kg (Adjusted), Intravenous, Q24H*  enoxaparin (LOVENOX) syringe 40 mg, 40 mg, Subcutaneous, QDAY  insulin aspart U-100 (NOVOLOG FLEXPEN) injection PEN 0-12 Units, 0-12 Units, Subcutaneous, ACHS (22)  insulin NPH (HUMULIN N KwikPen) injection PEN 8 Units, 8 Units, Subcutaneous, BID(8-21)  magnesium sulfate   1 g/D5W 100 mL IVPB, 1 g, Intravenous, Q1H X 2DO  meropenem (MERREM) 1 g in sodium chloride 0.9% (NS) 100 mL IVPB (MB+), 1 g, Intravenous, Q12H*  pantoprazole DR (PROTONIX) tablet 40 mg, 40 mg, Oral, QDAY  sodium phosphate 16 mmol in dextrose 5% (D5W) 250 mL IVPB, 16 mmol, Intravenous, Q6H  vitamins, multi w/minerals tablet 1 tablet, 1 tablet, Oral, QDAY    Continuous Infusions:  PRN and Respiratory Meds:acetaminophen/lidocaine/antacid DS(#) Q3H PRN, fentaNYL citrate PF Q2H PRN, polyethylene glycol 3350 QDAY PRN      Objective                       Vital Signs: Last Filed                 Vital Signs: 24 Hour Range   BP: 125/75 (08/31 0600) Temp: 37.1 ???C (98.8 ???F) (08/31 0600)  Pulse: 81 (08/31 0600)  Respirations: 18 PER MINUTE (08/31 0600)  SpO2: 99 % (08/31 0600) BP: (105-125)/(60-75)   Temp:  [36.7 ???C (98 ???F)-37.1 ???C (98.8 ???F)]   Pulse:  [78-93]   Respirations:  [18 PER MINUTE-20 PER MINUTE]   SpO2:  [99 %-100 %]      Vitals:    06/10/19 0800 06/11/19 1524   Weight: 67.6 kg (149 lb 0.5 oz) 67.6 kg (149 lb)  Intake/Output Summary:  (Last 24 hours)    Intake/Output Summary (Last 24 hours) at 06/18/2019 0901  Last data filed at 06/17/2019 1719  Gross per 24 hour   Intake 236 ml   Output 400 ml   Net -164 ml      Stool Occurrence: 1    Physical Exam  General:  Alert, cooperative, no distress, appears stated age  Head:  Normocephalic, atraumatic.   Eyes:  Conjunctivae clear  Lungs:  Clear to auscultation bilaterally  Heart:   Regular rate and rhythm, S1, S2 normal, no murmur.  Abdomen:  Soft, non-tender. Bowel sounds normal.  Extremities: Extremities normal, atraumatic, no cyanosis or edema  Pulses:  2+ and symmetric, all extremities  Neurologic:  Alert and oriented x 3. No focal deficits.    Lab Review  24-hour labs:    Results for orders placed or performed during the hospital encounter of 06/10/19 (from the past 24 hour(s))   POC GLUCOSE    Collection Time: 06/17/19 12:57 PM   Result Value Ref Range    Glucose, POC 326 (H) 70 - 100 MG/DL   POC GLUCOSE    Collection Time: 06/17/19  5:22 PM   Result Value Ref Range    Glucose, POC 227 (H) 70 - 100 MG/DL   POC GLUCOSE    Collection Time: 06/17/19  9:24 PM   Result Value Ref Range    Glucose, POC 260 (H) 70 - 100 MG/DL   CBC AND DIFF    Collection Time: 06/18/19  4:11 AM   Result Value Ref Range    White Blood Cells 13.0 (H) 4.5 - 11.0 K/UL    RBC 3.46 (L) 4.4 - 5.5 M/UL    Hemoglobin 8.6 (L) 13.5 - 16.5 GM/DL    Hematocrit 16.1 (L) 40 - 50 %    MCV 76.1 (L) 80 - 100 FL    MCH 24.8 (L) 26 - 34 PG    MCHC 32.6 32.0 - 36.0 G/DL    RDW 09.6 (H) 11 - 15 %    Platelet Count 130 (L) 150 - 400 K/UL MPV 8.4 7 - 11 FL    Neutrophils 86 (H) 41 - 77 %    Lymphocytes 9 (L) 24 - 44 %    Monocytes 3 (L) 4 - 12 %    Eosinophils 1 0 - 5 %    Basophils 1 0 - 2 %    Absolute Neutrophil Count 11.25 (H) 1.8 - 7.0 K/UL    Absolute Lymph Count 1.20 1.0 - 4.8 K/UL    Absolute Monocyte Count 0.40 0 - 0.80 K/UL    Absolute Eosinophil Count 0.06 0 - 0.45 K/UL    Absolute Basophil Count 0.08 0 - 0.20 K/UL   COMPREHENSIVE METABOLIC PANEL    Collection Time: 06/18/19  4:11 AM   Result Value Ref Range    Sodium 140 137 - 147 MMOL/L    Potassium 3.5 3.5 - 5.1 MMOL/L    Chloride 111 (H) 98 - 110 MMOL/L    Glucose 109 (H) 70 - 100 MG/DL    Blood Urea Nitrogen 16 7 - 25 MG/DL    Creatinine 0.45 0.4 - 1.24 MG/DL    Calcium 7.7 (L) 8.5 - 10.6 MG/DL    Total Protein 5.6 (L) 6.0 - 8.0 G/DL    Total Bilirubin 1.3 (H) 0.3 - 1.2 MG/DL    Albumin 2.3 (L) 3.5 - 5.0 G/DL    Alk Phosphatase 409 (H)  25 - 110 U/L    AST (SGOT) 58 (H) 7 - 40 U/L    CO2 23 21 - 30 MMOL/L    ALT (SGPT) 131 (H) 7 - 56 U/L    Anion Gap 6 3 - 12    eGFR Non African American >60 >60 mL/min    eGFR African American >60 >60 mL/min   MAGNESIUM    Collection Time: 06/18/19  4:11 AM   Result Value Ref Range    Magnesium 1.4 (L) 1.6 - 2.6 mg/dL   PHOSPHORUS    Collection Time: 06/18/19  4:11 AM   Result Value Ref Range    Phosphorus 1.7 (L) 2.0 - 4.5 MG/DL   HQ46.9    Collection Time: 06/18/19  4:11 AM   Result Value Ref Range    CA 19-9 >17000 (H) <35 U/ml   POC GLUCOSE    Collection Time: 06/18/19  7:56 AM   Result Value Ref Range    Glucose, POC 136 (H) 70 - 100 MG/DL        Point of Care Testing  (Last 24 hours)  Glucose: (!) 109 (06/18/19 0411)  POC Glucose (Download): (!) 136 (06/18/19 0756)    Radiology and other Diagnostics Review:    Pertinent radiology reviewed.

## 2019-06-18 NOTE — Progress Notes
Infectious Diseases Progress Note    Today's Date:  06/18/2019  Admission Date: 06/10/2019    Reason for this consultation: Abdominal pain and sepsis    Assessment:     Septic Shock  Ascending Cholangitis  Enterococcal bacteremia (E faecalis and E casseoflavus)  Persistent Leukocytosis  08/23 - transferred to [place] from [facility] for complaint of abdominal pain and hx of pancreatic adenocarcinoma  08/23 - CT from OSH in [place], [place] showed mass like thickening in the duodenum around stent and pneumobilia  08/23 - planned ERCP cancelled due to worsening hypotension. Patient RR and transferred to MICU  08/23 - BC + smear for gram+ cocci resembling streptococc. Culture w enterococcus casselifllavus and faecalis  8/23: UA negative for nitrates/leukocytosis. Procalcitonin > 100,000  08/23 - Cefepime and Metronidazole started  08/24 - metronidazole and cefedime d/c and linezolid, and meroperum started  08/24 - Repeat BC drawn- Enterococcus faecalis-   08/24 - ERCP- GI placed new biliary stent (pus behind obstructed stent)  08/24 - TTE showed no vegetations on ICD leads, aortic and pulmonary valves poorly visualized  08/26 - WBC increased to 25.0 < 24.0 < 18.4  08/25 - BC ngtd (3 days)    Thrush    Comorbidities:  - Pancreatic adenocarcinoma clinical stage Ia (Dec 2019)  - T2DM (A1C 6.9 8/24)  - CKD 3 (creatinine 2.15, baseline ~ 1.5)  - Systolic HF (ICD with LVEF 25% 08/24)    Recommendations:     1. Cont Daptomycin 8mg /kg IV q 24hr. Plan to complete 14d from 8/25 (stop date) 06/25/19  2. DC Meropenem   3. Repeat CK today (added to specimen in lab)  4. Trend Alk phos and bili- this may be resolving sepsis/meds but would have GI eval if continues  5. Trend WBC, sl increase  6. CM to eval outpt options for continuing Dapto post discharge  7. Continue Nystatin S/S- would extend another 5 days given some residual thrush   8. At DC weekly CBC, CMP, CK - fax to ID [id number] Complexity of medical decision making is high b/c of the multi-system nature of the infectious disease process and concerns about the complexity of the patient illness including the sensitivity of the organisms being treated, the potential for drug toxicity and interactions, concerns about immunologic function, and interplay of other issues.        Interval History     Afebrile VSS  Reports some ongoing mouth pain- wasn't taking mouth medicine- restarted so seems better  No fever, chills  Mild abd pain this am- now better  No cough or sob  No CP  ROS otherwise neg on 10 pt review    Antimicrobial Start date End date   Cefepime 08/23 08/24   Linezolide 08/24    Meropenum 08/23    Metronidazole 08/23 08/23   Vancomycin 08/24 08/24             Estimated Creatinine Clearance: 86.3 mL/min (based on SCr of 0.74 mg/dL).      Medications   Scheduled Meds:allopurinoL (ZYLOPRIM) tablet 300 mg, 300 mg, Oral, QDAY  aspirin EC tablet 81 mg, 81 mg, Oral, QDAY  DAPTOmycin (CUBICIN) injection 500 mg, 8 mg/kg (Adjusted), Intravenous, Q24H*  enoxaparin (LOVENOX) syringe 40 mg, 40 mg, Subcutaneous, QDAY  insulin aspart U-100 (NOVOLOG FLEXPEN) injection PEN 0-12 Units, 0-12 Units, Subcutaneous, ACHS (22)  insulin NPH (HUMULIN N KwikPen) injection PEN 8 Units, 8 Units, Subcutaneous, BID(8-21)  meropenem (MERREM) 1 g in sodium chloride  0.9% (NS) 100 mL IVPB (MB+), 1 g, Intravenous, Q12H*  pantoprazole DR (PROTONIX) tablet 40 mg, 40 mg, Oral, QDAY  sodium phosphate 16 mmol in dextrose 5% (D5W) 250 mL IVPB, 16 mmol, Intravenous, Q6H  vitamins, multi w/minerals tablet 1 tablet, 1 tablet, Oral, QDAY    Continuous Infusions:    PRN and Respiratory Meds:acetaminophen Q6H PRN, acetaminophen/lidocaine/antacid DS(#) Q3H PRN, oxyCODONE Q8H PRN, polyethylene glycol 3350 QDAY PRN      Physical Examination                          Vital Signs: Last                  Vital Signs: 24 Hour Range   BP: 125/75 (08/31 0600) Temp: 37.1 ???C (98.8 ???F) (08/31 0600)  Pulse: 81 (08/31 0600)  Respirations: 18 PER MINUTE (08/31 0600)  SpO2: 99 % (08/31 0600) BP: (105-125)/(60-75)   Temp:  [36.7 ???C (98 ???F)-37.1 ???C (98.8 ???F)]   Pulse:  [78-93]   Respirations:  [18 PER MINUTE-20 PER MINUTE]   SpO2:  [99 %-100 %]      General appearance: alert, oriented, NAD  HENT: mucus membranes moist, thrush, better on tongue, still some patches posteriorly  Eyes:  EOM grossly intact, Conj nl  Neck: supple,   Lungs: no wheezing, rhonchi, rales appreciated  Heart: Regular rhythm, reg rate, with no murmur, rub, gallop  Abdomen: soft, non-tender, non-distended, normoactive bowel sounds,  no masses  Ext:  No r edema  Skin: no rashes/lesions. Left chest w ICD- no warmth or swelling or tenderness  Lymph: no adenopathy  MSK: no swollen joints   Psych: calm  Neuro: oriented to person and place, MAE     Lines:   - PI      Lab Review   Hematology  Recent Labs     06/16/19  0420 06/17/19  0429 06/18/19  0411   WBC 10.1 12.7* 13.0*   HGB 9.2* 8.9* 8.6*   HCT 28.2* 28.5* 26.4*   PLTCT 71* 96* 130*     Chemistry  Recent Labs     06/16/19  0420 06/17/19  0429 06/18/19  0411   NA 138 141 140   K 4.2 4.1 3.5   CL 112* 112* 111*   CO2 19* 21 23   BUN 24 19 16    CR 0.91 0.78 0.74   GFR >60 >60 >60   GLU 155* 167* 109*   CA 8.0* 8.0* 7.7*   PO4 1.5* 1.5* 1.7*   ALBUMIN 2.5* 2.3* 2.3*   ALKPHOS 384* 425* 501*   AST 69* 47* 58*   ALT 206* 153* 131*   TOTBILI 1.5* 1.4* 1.3*       Microbiology, Radiology and other Diagnostics Review   Microbiology data reviewed.    Pertinent radiology images viewed.     Arn Medal, MD

## 2019-06-19 ENCOUNTER — Encounter: Admit: 2019-06-19 | Discharge: 2019-06-19

## 2019-06-19 LAB — POC GLUCOSE
Lab: 303 mg/dL — ABNORMAL HIGH (ref 70–100)
Lab: 124 mg/dL — ABNORMAL HIGH (ref 70–100)
Lab: 200 mg/dL — ABNORMAL HIGH (ref 70–100)
Lab: 228 mg/dL — ABNORMAL HIGH (ref 70–100)

## 2019-06-19 LAB — COMPREHENSIVE METABOLIC PANEL: Lab: 141 MMOL/L — ABNORMAL LOW (ref 137–147)

## 2019-06-19 LAB — IRON + BINDING CAPACITY + %SAT+ FERRITIN
Lab: 10 ug/dL — ABNORMAL LOW (ref 50–185)
Lab: 227 ng/mL (ref 30–300)
Lab: 231 ug/dL — ABNORMAL LOW (ref 270–380)
Lab: 4 % — ABNORMAL LOW (ref 28–42)

## 2019-06-19 LAB — MAGNESIUM: Lab: 1.5 mg/dL — ABNORMAL LOW (ref >60)

## 2019-06-19 LAB — PHOSPHORUS: Lab: 3.1 mg/dL — ABNORMAL HIGH (ref 2.0–4.5)

## 2019-06-19 LAB — CBC AND DIFF: Lab: 12 K/UL — ABNORMAL HIGH (ref 4.5–11.0)

## 2019-06-19 MED ORDER — THIAMINE HCL (VITAMIN B1) 100 MG PO TAB
100 mg | ORAL_TABLET | Freq: Every day | ORAL | 0 refills | 10.00000 days | Status: CN
Start: 2019-06-19 — End: ?

## 2019-06-19 MED ORDER — POLYETHYLENE GLYCOL 3350 17 GRAM PO PWPK
17 g | Freq: Every day | ORAL | 1 refills | 18.00000 days | Status: CN | PRN
Start: 2019-06-19 — End: ?

## 2019-06-19 MED ORDER — POTASSIUM CHLORIDE 20 MEQ PO TBTQ
60 meq | Freq: Once | ORAL | 0 refills | Status: DC
Start: 2019-06-19 — End: 2019-06-19

## 2019-06-19 MED ORDER — POTASSIUM CHLORIDE 20 MEQ/15 ML PO LIQD
60 meq | Freq: Once | ORAL | 0 refills | Status: CP
Start: 2019-06-19 — End: ?
  Administered 2019-06-20: 60 meq via ORAL

## 2019-06-19 MED ORDER — ACETAMINOPHEN/LIDOCAINE/ANTACID DS(#) 1:1:3  PO SUSP
30 mL | Freq: Three times a day (TID) | ORAL | 0 refills | Status: DC
Start: 2019-06-19 — End: 2019-06-20
  Administered 2019-06-19 – 2019-06-20 (×2): 30 mL via ORAL

## 2019-06-19 MED ORDER — RXAMB LIDO/ANTACID/APAP 1:3:1 SUSP (COMPOUND)
30 mL | Freq: Three times a day (TID) | ORAL | 3 refills | Status: DC | PRN
Start: 2019-06-19 — End: 2019-06-20
  Filled 2019-06-20: qty 560, 6d supply, fill #1

## 2019-06-19 MED ORDER — MAGNESIUM SULFATE IN D5W 1 GRAM/100 ML IV PGBK
1 g | INTRAVENOUS | 0 refills | Status: CP
Start: 2019-06-19 — End: ?
  Administered 2019-06-19 (×2): 1 g via INTRAVENOUS

## 2019-06-19 MED ORDER — MULTIVIT-IRON-FA-CALCIUM-MINS 9 MG IRON-400 MCG PO TAB
1 | ORAL_TABLET | Freq: Every day | ORAL | 0 refills | 33.00000 days | Status: CN
Start: 2019-06-19 — End: ?

## 2019-06-19 MED ORDER — ACETAMINOPHEN/LIDOCAINE/ANTACID DS(#) 1:1:3  PO SUSP
30 mL | Freq: Four times a day (QID) | ORAL | 0 refills | Status: DC
Start: 2019-06-19 — End: 2019-06-19

## 2019-06-19 MED ORDER — NYSTATIN 100,000 UNIT/ML PO SUSP
500000 [IU] | Freq: Four times a day (QID) | ORAL | 0 refills | Status: DC
Start: 2019-06-19 — End: 2019-06-20
  Administered 2019-06-19 – 2019-06-20 (×5): 500000 [IU] via ORAL

## 2019-06-19 MED ORDER — DAPTOMYCIN SYR
8 mg/kg | INTRAVENOUS | 0 refills | 7.00000 days | Status: AC
Start: 2019-06-19 — End: ?

## 2019-06-19 MED ORDER — ACETAMINOPHEN 500 MG PO TAB
500 mg | ORAL_TABLET | ORAL | 0 refills | Status: CN | PRN
Start: 2019-06-19 — End: ?

## 2019-06-19 MED ORDER — NYSTATIN 100,000 UNIT/ML PO SUSP
500000 [IU] | Freq: Four times a day (QID) | ORAL | 0 refills | Status: CN
Start: 2019-06-19 — End: ?

## 2019-06-19 NOTE — Progress Notes
PHYSICAL THERAPY  MOBILITY NOTE      Name: Dustin Blackwell        MRN: [id number]          DOB: 09/13/1946          Age: 73 y.o.  Admission Date: 06/10/2019             LOS: 9 days        Patient was assigned for activity with the mobility aide by the supervising therapist.  Patient declined to participate despite encouragement. Pt asleep upon arrival and his relative asked technician to check back at another time.  Rehab technician will continue to follow pt.      Rehab Technician: Jeoffrey Massed  Date: 06/19/2019

## 2019-06-19 NOTE — Progress Notes
Reason for Visit:  Rounding    Faith/Religion:  Christian    Method(s) of Coping:  Prayer and friendship wih cousin, grateful for feeling better    Support System:  Cousin    Interventions/Plan: Entering the room, the patient was asleep. I talked with the cousin. He said that Bailey has pancreatic cancer and will be discharged soon. Hart woke up.He did not seem overjoyed to be leaving soon, but he did say he was feeling so much better than when he come to the Hospital.I offered a prayer and blaessing which they were happy for.     Pager:  [phone]   PCU: 4    The spiritual care team is available as needed, 24/7, through the campus switchboard [phone]).  For immediate response, please page [phone].  For a response within 24 hours, please submit an order in [facility]2 for a chaplain consult.

## 2019-06-19 NOTE — Progress Notes
Met with pt and family member to demonstrate the administration of Daptomycin via IVP q 24hr. Pt is relying on his niece that is a dialysis nurse to assist him daily with IVP. Other family member took pictures and declined to return demo. He verbally demonstrated the [facility] set up, hand hygiene and abx to be set out 30 prior to dose.  I gave them my card and asked them to have niece call me with any questions.  I reviewed what the contents of the box will be and the roles of [facility] and [facility]. Pt nor family had any questions at this time.    Marvell Fuller BSN RN

## 2019-06-19 NOTE — Case Management (ED)
DME Delivery Request    Delivered [employer] standard roller walker to pt's bedside per the request of Kelsey Morris, RNCM.     Dustin Blackwell  Case Management Assistant

## 2019-06-20 ENCOUNTER — Ambulatory Visit: Admit: 2019-06-08 | Discharge: 2019-06-08

## 2019-06-20 ENCOUNTER — Encounter: Admit: 2019-06-11 | Discharge: 2019-06-11

## 2019-06-20 ENCOUNTER — Encounter: Admit: 2019-06-20 | Discharge: 2019-06-20

## 2019-06-20 ENCOUNTER — Encounter: Admit: 2019-06-10 | Discharge: 2019-06-10

## 2019-06-20 ENCOUNTER — Ambulatory Visit
Admit: 2019-06-10 | Discharge: 2019-06-20 | Disposition: A | Discharge status: Home Health | Source: Other Acute Inpatient Hospital

## 2019-06-20 DIAGNOSIS — Z888 Allergy status to other drugs, medicaments and biological substances status: Secondary | ICD-10-CM

## 2019-06-20 DIAGNOSIS — K859 Acute pancreatitis without necrosis or infection, unspecified: Secondary | ICD-10-CM

## 2019-06-20 DIAGNOSIS — Z515 Encounter for palliative care: Secondary | ICD-10-CM

## 2019-06-20 DIAGNOSIS — N179 Acute kidney failure, unspecified: Secondary | ICD-10-CM

## 2019-06-20 DIAGNOSIS — Z6823 Body mass index (BMI) 23.0-23.9, adult: Secondary | ICD-10-CM

## 2019-06-20 DIAGNOSIS — E1122 Type 2 diabetes mellitus with diabetic chronic kidney disease: Secondary | ICD-10-CM

## 2019-06-20 DIAGNOSIS — E872 Acidosis: Secondary | ICD-10-CM

## 2019-06-20 DIAGNOSIS — Z1159 Encounter for screening for other viral diseases: Secondary | ICD-10-CM

## 2019-06-20 DIAGNOSIS — E875 Hyperkalemia: Secondary | ICD-10-CM

## 2019-06-20 DIAGNOSIS — G9341 Metabolic encephalopathy: Secondary | ICD-10-CM

## 2019-06-20 DIAGNOSIS — R000 Tachycardia, unspecified: Secondary | ICD-10-CM

## 2019-06-20 DIAGNOSIS — T368X5A Adverse effect of other systemic antibiotics, initial encounter: Secondary | ICD-10-CM

## 2019-06-20 DIAGNOSIS — I2510 Atherosclerotic heart disease of native coronary artery without angina pectoris: Secondary | ICD-10-CM

## 2019-06-20 DIAGNOSIS — Z79899 Other long term (current) drug therapy: Secondary | ICD-10-CM

## 2019-06-20 DIAGNOSIS — E43 Unspecified severe protein-calorie malnutrition: Secondary | ICD-10-CM

## 2019-06-20 DIAGNOSIS — A4181 Sepsis due to Enterococcus: Secondary | ICD-10-CM

## 2019-06-20 DIAGNOSIS — K219 Gastro-esophageal reflux disease without esophagitis: Secondary | ICD-10-CM

## 2019-06-20 DIAGNOSIS — Z87891 Personal history of nicotine dependence: Secondary | ICD-10-CM

## 2019-06-20 DIAGNOSIS — Z91018 Allergy to other foods: Secondary | ICD-10-CM

## 2019-06-20 DIAGNOSIS — Z951 Presence of aortocoronary bypass graft: Secondary | ICD-10-CM

## 2019-06-20 DIAGNOSIS — Z7982 Long term (current) use of aspirin: Secondary | ICD-10-CM

## 2019-06-20 DIAGNOSIS — D6959 Other secondary thrombocytopenia: Secondary | ICD-10-CM

## 2019-06-20 DIAGNOSIS — I130 Hypertensive heart and chronic kidney disease with heart failure and stage 1 through stage 4 chronic kidney disease, or unspecified chronic kidney disease: Secondary | ICD-10-CM

## 2019-06-20 DIAGNOSIS — G4733 Obstructive sleep apnea (adult) (pediatric): Secondary | ICD-10-CM

## 2019-06-20 DIAGNOSIS — N183 Chronic kidney disease, stage 3 (moderate): Secondary | ICD-10-CM

## 2019-06-20 DIAGNOSIS — R748 Abnormal levels of other serum enzymes: Secondary | ICD-10-CM

## 2019-06-20 DIAGNOSIS — K831 Obstruction of bile duct: Secondary | ICD-10-CM

## 2019-06-20 DIAGNOSIS — K8309 Other cholangitis: Secondary | ICD-10-CM

## 2019-06-20 DIAGNOSIS — B379 Candidiasis, unspecified: Secondary | ICD-10-CM

## 2019-06-20 DIAGNOSIS — R6521 Severe sepsis with septic shock: Secondary | ICD-10-CM

## 2019-06-20 DIAGNOSIS — Z95810 Presence of automatic (implantable) cardiac defibrillator: Secondary | ICD-10-CM

## 2019-06-20 DIAGNOSIS — Z7984 Long term (current) use of oral hypoglycemic drugs: Secondary | ICD-10-CM

## 2019-06-20 DIAGNOSIS — Z880 Allergy status to penicillin: Secondary | ICD-10-CM

## 2019-06-20 DIAGNOSIS — C259 Malignant neoplasm of pancreas, unspecified: Secondary | ICD-10-CM

## 2019-06-20 DIAGNOSIS — J449 Chronic obstructive pulmonary disease, unspecified: Secondary | ICD-10-CM

## 2019-06-20 DIAGNOSIS — I5022 Chronic systolic (congestive) heart failure: Secondary | ICD-10-CM

## 2019-06-20 DIAGNOSIS — Z66 Do not resuscitate: Secondary | ICD-10-CM

## 2019-06-20 LAB — POC GLUCOSE
Lab: 260 mg/dL — ABNORMAL HIGH (ref 70–100)
Lab: 171 mg/dL — ABNORMAL HIGH (ref 70–100)

## 2019-06-20 LAB — FOLATE, SERUM: Lab: 4.6 ng/mL — ABNORMAL LOW (ref >3.9)

## 2019-06-20 LAB — CBC AND DIFF: Lab: 9.8 10*3/uL — ABNORMAL LOW (ref >60)

## 2019-06-20 LAB — CBC
Lab: 143 10*3/uL — ABNORMAL LOW (ref 150–400)
Lab: 18 % — ABNORMAL HIGH (ref 11–15)
Lab: 24 % — ABNORMAL LOW (ref 40–50)
Lab: 25 pg — ABNORMAL LOW (ref 26–34)
Lab: 3 M/UL — ABNORMAL LOW (ref 4.4–5.5)
Lab: 32 g/dL (ref 32.0–36.0)
Lab: 7.8 g/dL — ABNORMAL LOW (ref 13.5–16.5)
Lab: 79 FL — ABNORMAL LOW (ref 80–100)
Lab: 8.1 FL (ref 7–11)
Lab: 9.1 10*3/uL (ref 4.5–11.0)

## 2019-06-20 LAB — COMPREHENSIVE METABOLIC PANEL
Lab: 12 mg/dL — ABNORMAL LOW (ref 7–25)
Lab: 142 MMOL/L — ABNORMAL LOW (ref 137–147)

## 2019-06-20 LAB — VITAMIN B12: Lab: 531 pg/mL — ABNORMAL LOW (ref >60)

## 2019-06-20 LAB — MAGNESIUM: Lab: 1.7 mg/dL — ABNORMAL LOW (ref 1.6–2.6)

## 2019-06-20 MED ORDER — FERROUS SULFATE 325 MG (65 MG IRON) PO TAB
325 mg | Freq: Every day | ORAL | 0 refills | Status: DC
Start: 2019-06-20 — End: 2019-06-20

## 2019-06-20 MED ORDER — RXAMB LIDO/ANTACID/APAP 1:3:1 SUSP (COMPOUND)
30 mL | Freq: Three times a day (TID) | ORAL | 3 refills | 28.00000 days | Status: AC | PRN
Start: 2019-06-20 — End: ?

## 2019-06-20 MED ORDER — OXYCODONE 5 MG PO TAB
5 mg | ORAL_TABLET | Freq: Two times a day (BID) | ORAL | 0 refills | 6.00000 days | Status: AC | PRN
Start: 2019-06-20 — End: ?
  Filled 2019-06-20: qty 10, 5d supply, fill #1

## 2019-06-20 MED ORDER — FERROUS SULFATE 325 MG (65 MG IRON) PO TAB
325 mg | ORAL_TABLET | Freq: Every day | ORAL | 1 refills | Status: CN
Start: 2019-06-20 — End: ?

## 2019-06-20 NOTE — Progress Notes
Dustin Blackwell discharged on 06/20/2019.   Marland Kitchen  Discharge instructions reviewed with patient and family.  Valuables returned:   Personal Items / Valuables: Eyeglasses/Contacts(Glasses to daughter)  Where Are Valuables Stored?: with patient.  Home medications:    .  Functional assessment at discharge complete: Yes .

## 2019-06-20 NOTE — Progress Notes
Medication Delivery Date/Place:  Medication & supplies to be delivered to patient's home on 06/20/19.    Start of Care:  Home doses to begin on 06/21/19.    Teach:  Bedside teach conducted by Dwana Curd on 06/19/19.    Drug:  Daptomycin 500 mg  Frequency: Q 24 hours  Adm Method: IV push  Adm Directions:  Administer Daptomycin 500 mg (10 ml) IV every 24 hours. Infuse over approximately 2 minutes via slow IV push.    Duration of Therapy: Through 06/25/19    Access: PICC SL  Flush: NS 5-9ml before and after each dose; Heparin 10 units/ml 5 ml as final flush/lock    Lab orders: CBC w/ Diff, CMP, CPK - Weekly starting on Monday, 06/25/19  Fax results to: Dr Shon Hough [phone]                         Dr Ventura Sellers [place] [phone]    [place]: [place]    Following MD: [place] Pharmacist: Merrilee Jansky

## 2019-06-20 NOTE — Progress Notes
PHYSICAL THERAPY  NOTE      Name: Dustin Blackwell        MRN: [id number]          DOB: May 19, 1946          Age: 73 y.o.  Admission Date: 06/10/2019             LOS: 10 days      Patient declined to participate despite encouragement and education about the role and benefits of physical therapy.  Pt reports he is discharging home today and has no concerns regarding mobility post-discharge.  Pt's personal walker was in room and was adjusted for proper height.  Should pt not discharge, physical therapy service will continue to follow and provide interventions as indicated.      Therapist: Deretha Emory  Date: 06/20/2019

## 2019-06-20 NOTE — Case Management (ED)
Case Management Progress Note    NAME:Dustin Blackwell                          MRN: [id number]              DOB:June 25, 1946          AGE: 73 y.o.  ADMISSION DATE: 06/10/2019             DAYS ADMITTED: LOS: 10 days      Today???s Date: 06/20/2019    Plan  Patient to discharge home today with IV Daptomycin and home health care.    Interventions  ? Support   Support: Pt/Family Updates re:POC or DC Plan, Patient Education   ??? NCM reviewed EMR.  ??? NCM attended and participated in MPM huddle.  ??? Patient is stable for discharge today.  ??? NCM met with patient and his cousin at bedside to review discharge plan of care. NCM answered all questions.  ? Info or Referral   Information or Referral to Community Resources: No Needs Identified  ? Discharge Planning   Discharge Planning: Home Health, Home Infusion-[facility]   ??? Patient has received bedside teach for IV antibiotic administration on 9/1.  ??? NCM confirmed with [facility] coordinator Marchelle Folks that medications will be delivered to patient's house.  ??? NCM called [facility] (Phone: [phone]; Fax: [phone]) to provide an update on discharge.  ??? NCM faxed signed orders and DC AVS to both [facility] and [facility].  ??? No further NCM needs upon discharge.  ? Medication Needs   Medication Needs: No Needs Identified  ??? Financial   Financial: No Needs Identified  ? Legal   Legal: No Needs Identified  ? Other   Other/None: No needs identified    Disposition  ? Expected Discharge Date    Expected Discharge Date: 06/20/19  Expected Discharge Time: 1200  Discharge Planning Comments: dc planning ongoing  ? Transportation   Does the patient need discharge transport arranged?: No  Transportation Name, Phone and Availability #1: dtr Stage manager Name, Phone and Availability #2: Noah Charon  Does the patient use Medicaid Transportation?: No  ? Next Level of Care (Acute Psych discharges only)      ? Discharge Disposition Durable Medical Equipment      No service has been selected for the patient.      [place] Destination      No service has been selected for the patient.      [facility] - Selection Complete      Service Provider Request Status Selected Services Address Phone Number Fax Number Patient Preferred    [facility] [address], [place] [address] [phone] ???      [facility] - Selection Complete      Service Provider Request Status Selected Services Address Phone Number Fax Number Patient Preferred    *[facility]fusion and [facility] [address], [place] [address] [phone] ???        Lilli Few, BSN, RN  Med Private M Nurse Case Manager  Phone: [phone]  Pager: [phone]

## 2019-06-20 NOTE — Care Plan
Problem: Discharge Planning  Goal: Participation in plan of care  Outcome: Goal Achieved  Goal: Knowledge regarding plan of care  Outcome: Goal Achieved  Goal: Prepared for discharge  Outcome: Goal Achieved     Problem: Infection, Risk of  Goal: Absence of infection  Outcome: Goal Achieved  Goal: Knowledge of Infection Control Procedures  Outcome: Goal Achieved     Problem: Falls, High Risk of  Goal: Absence of falls-Adult Patient  Outcome: Goal Achieved  Goal: Absence of Falls-Pediatric patient  Outcome: Goal Achieved     Problem: Skin Integrity  Goal: Skin integrity intact  Outcome: Goal Achieved  Goal: Healing of skin (Wound & Incision)  Outcome: Goal Achieved  Goal: Healing of skin (Pressure Injury)  Outcome: Goal Achieved     Problem: Infection, Risk of, Central Venous Catheter-Associated Bloodstream Infection  Goal: Absence of CVC Associated Bloodstream infection  Outcome: Goal Achieved     Problem: Mobility/Activity Intolerance  Goal: Maximize functional ADL's and mobility outcomes  Outcome: Goal Achieved     Problem: Nutrition Deficit  Goal: Adequate nutritional intake  Outcome: Goal Achieved

## 2019-06-20 NOTE — Telephone Encounter
Page received about Dustin Blackwell, who was discharged today. Message stating that patient wants to go to a nursing home and can't take care for himself. Called operator, asking to page hospitalist on-call as this patient was discharged < 7 days ago.     Valerie Salts, M.D., PGY2  Clinic Resident On-Call  Available on [facility]

## 2019-06-21 ENCOUNTER — Encounter: Admit: 2019-06-21 | Discharge: 2019-06-21

## 2019-06-21 NOTE — Telephone Encounter
ACTIVE OPAT: 06/21/2019  Hosp D/C Date: 06/20/2019  ID Dr. Ventura Sellers  Next f/u: PRN    Diagnosis: Enterococcal bacteremia     --Daptomycin 8 mg /kg= 500 mg  IV Q 24 hrs x 14 days from 8/25  Start date: 06/14/2019    End date: 06/25/2019    Labs:CBC w/Diff, CMP, CPK x1  Start: 06/25/2019     Line: SL Picc dc at end of tx    [facility]:Phone [phone] / Fax [phone] [facility] note reviewed and correct.    [facility]: Ph=[phone] / Fax= [phone]    [facility] RN confirmed orders, but states pt got admitted to [facility] last night so they were unable to start him today. She does not know what was wrong with pt.     [facility] DME Orders Reviewed and Correct: Yes

## 2019-06-21 NOTE — Progress Notes
Day of Discharge Note    Day of discharge progress note for Dustin Blackwell    Chart data reviewed including medications, consultation notes, lab, vitals, imaging.  Patient was seen and examined with pertinent information listed below.    Subjective: No events reported overnight. The patient notes he is feeling well and denies any new complaints or concerns. He notes he is tolerating fluid intake and food intake without n/v or abdominal pain or bloating. He notes regular bowel movements and denies noting any blood.     Exam:   General:  Alert, cooperative, no distress  Head:  Normocephalic, without obvious abnormality, atraumatic  Eyes:  Conjunctivae/corneas clear.  PERRL, EOMs intact.    Lungs:  Clear to auscultation bilaterally  Heart:   Regular rate and rhythm, S1, S2 normal, no murmur, click rub or gallop  Abdomen:  Soft, non-tender. Bowel sounds normal.    Extremities: Extremities normal, atraumatic, no cyanosis or edema, PICC RUE  Neurologic:  Alert and oriented x 3, no focal deficits      Discharge plans and pertinent follow up items after discharge:   - Patient to continue Daptomycin until 9/7  - Monitor weekly CBC w/ diff, CPK and CMP while on Dapto.  - Home health can remove PICC after abx completion  - Patient to follow up outpatient with Oncology    Patient feels comfortable with plans for discharge. All questions were answered. Discharge discussion, including follow up/discharge instructions, occurred with patient face-to-face.      Concepcion Elk, DO  06/20/2019    Discharge Planning: greater than 31 minutes spent in pt dc care today spent counseling pt, coordinating dc care, placing dc orders and helping complete dc summary.

## 2019-06-22 NOTE — Discharge Instructions - Pharmacy
Discharge Summary      Name: Dustin Blackwell  Medical Record Number: [id number]        Account Number:  [id number]  Date Of Birth:  1946/10/10                         Age:  73 years   Admit date:  06/10/2019                     Discharge date:  06/20/2019      Discharge Attending:  Derryl Harbor, DO  Discharge Summary Completed By: Steffanie Dunn, DO    Service: Med Private [phone]    Reason for hospitalization:  Abdominal pain [R10.9]    Primary Discharge Diagnosis:   Abdominal pain      Hospital Diagnoses:  Hospital Problems        Active Problems    * (Principal) Abdominal pain    Acute cholangitis    Septic shock due to undetermined organism [facility])    Cardiogenic shock (HCC)    AKI (acute kidney injury) (HCC)    Lactic acidosis    Severe malnutrition (HCC)        Significant Past Medical History        Arthritis  Back pain  Cancer of pancreas (HCC)  Congestive heart disease (HCC)  COPD (chronic obstructive pulmonary disease) (HCC)  Diabetes (HCC)  GERD (gastroesophageal reflux disease)  Hypertension  OSA (obstructive sleep apnea)  Prostate enlargement    Allergies   Strawberry; Lisinopril; and Penicillins    Brief Hospital Course   The patient was admitted and the following issues were addressed during this hospitalization: (with pertinent details including admission exam/imaging/labs).    ???  Septic shock 2/2 Enterococcal bacteremia and acute cholangitis   Patient was admitted on 06/10/19 via transfer from OSH for worsening RUQ pain. Patient was rapid responded on 06/10/19 for hypotension requiring pressor support and subsequent transfer to ICU, suspected to be from acute cholangitis. He received 3L of IVF during the rapid response and 1L NS in the ICU. He was scheduled to undergo an ERCP with a biliary stent on 06/10/19; however, due to his low blood pressure the procedure was canceled. He was stabilized and then underwent stent placement on 8/24 by Gastrobiliary. He was weaned off pressors, tapered off stress dose hydrocortisone, with last dose 06/16/19. Blood cultures from 8/23 growing Enterococcus casselifllavus and faecalis on 8/23. He was eventually weaned off and stable for floor transfer on 8/27. ID was consulted and followed throughout admission. He remained on Meropenem and Daptomycin. Repeat blood cultures 8/24 growing Enterococcus faecalis. Repeat blood cultures 8/25 negative. TTE showed no vegetations on ICD leads, aortic and pulmonary valves poorly visualized 8/24. He remains with persistently elevated WBC count, stable. He remained afebrile, hemodynamically stable. Meropenem changed to Ertapenem on 8/28. He completed Ertapenem on 8/31. He will continue on Daptomycin 8mg /kg IV q24hr with plan to???complete 14 days from 8/25???with stop date 06/25/19. Single lumen PICC placed 8/31 and home health arranged. He will need weekly CBC w/ diff, CMP and CPK faxed to ID clinic while on abx.  ??????  Acute pancreatitis  Biliary stent obstruction  Elevated liver enzymes  - History of bilary stent placement 2/2 pancreatic cancer  - Presented to OSH w/ RUQ pain - OSH CT w/ pneumobilia concerning for stent obstruction  - New stent placed by GI on 8/24   - Patient will need continued  stent exchanges every 6 weeks  - Tbili stable. Alk phos had been rising, but starting to improve 9/1. Will need continued monitoring and if worsening, GI evaluation.   ???  Hypophosphatemia  Hypomagnesemia  Hypokalemia  Refeeding syndrome  - Replaced aggressively and monitored  - Felt likely developed refeeding syndrome  - Dietitian followed during admission - recommend IV Thiamine 300mg  x 3 days then PO Thiamine 100mg  daily x 7 days due to refeeding syndrome  ???  Metastatic pancreatic cancer  - Follows outpatient with Dr. Tamsen Meek  - Patient has elected to not undergo any chemotherapy or surgery; however, has also declined hospice  - OSH CT prior to transfer???reportedly showing a new mass in the pelvis concerning for metastasis, pneumobilia and mild ductal dilatation  - CA 19-9 >17,000 on 8/31 (774 on 12/27/18)  - Oncology followed during admission - patient has not decided on pursuing chemo or hospice yet. He will further discuss this outpatient with his primary Oncologist. His Oncologist recommends hospice.     Chronic systolic HFrEF  HTN  - Dual chamber ICD in place    - 10/2015 Echo w/LVEF 20-25%  - 03/2018 Echo with LVEF 65%  - PTA ASA 81mg , Coreg 25mg  BID, Atorvastatin 40mg   - Echo 8/24 with EF ~25%  - Follows outpatient with Cardiology  - Patient will need repeat Echo in 4 weeks when recovered  - Discontinued Lasix and Coreg due to soft/normal blood pressures and poor PO intake  - Continued PTA ASA  - Also holding statin with LFT elevation  ???    Items Needing Follow Up   Pending items or areas that need to be addressed at follow up: Monitor CBC, CMP and CPK while on IV abx. Will need PICC removed by home health after abx completion on 9/7. Needs biliary stent exchanged every 6 weeks. Repeat Echo in 4 weeks.    Pending Labs and Follow Up Radiology    Pending labs and/or radiology review at this time of discharge are listed below: if this area is blank, there are no items for review. None    Medications      Medication List      START taking these medications    ??? acetaminophen 500 mg tablet; Commonly known as: TYLENOL; Dose: 500 mg;   Take one tablet by mouth every 6 hours as needed. Max of 4,000 mg of   acetaminophen in 24 hours.; Quantity: 90 tablet; Refills: 0  ??? DAPTOmycin 500 mg/10 mL Solr; Commonly known as: CUBICIN; Dose: 8 mg/kg;   Administer 10 mL through vein every 24 hours for 6 days.; Quantity: 1   each; Refills: 0  ??? ferrous sulfate 325 mg (65 mg iron) tablet; Commonly known as: FEOSOL;   Dose: 325 mg; Take one tablet by mouth daily. Take on an empty stomach at   least 1 hour before or 2 hours after food.; Quantity: 90 tablet; Refills:   1 ??? LIDO/ANTACID/APAP 1:3:1 SUSP (COMPOUND); Dose: 30 mL; Take 30 mL by   mouth three times daily as needed.; Quantity: 560 mL; Refills: 3;   Replaces: acetaminophen/lidocaine/antacid DS(#) 1:1:3   ??? oxyCODONE 5 mg tablet; Commonly known as: ROXICODONE; Dose: 5 mg; Take   one tablet by mouth every 12 hours as needed for Pain; Quantity: 10   tablet; Refills: 0  ??? polyethylene glycol 3350 17 g packet; Commonly known as: MIRALAX; Dose:   17 g; Take one packet by mouth daily as needed.; Quantity: 12 each;  Refills: 1  ??? thiamine HCL 100 mg tablet; Commonly known as: VITAMIN B-1; Dose: 100   mg; Take one tablet by mouth daily.; Quantity: 7 tablet; Refills: 0  ??? vitamins, multi w/minerals 9 mg iron-400 mcg Tab; Dose: 1 tablet; Take   one tablet by mouth daily.; Quantity: 90 tablet; Refills: 0     CONTINUE taking these medications    ??? allopurinoL 300 mg tablet; Commonly known as: ZYLOPRIM; Dose: 1 tablet;   Refills: 0  ??? aspirin EC 81 mg tablet; Dose: 81 mg; Refills: 0  ??? empagliflozin 25 mg tablet; Commonly known as: JARDIANCE; Dose: 1   tablet; Refills: 0  ??? metFORMIN 1,000 mg tablet; Commonly known as: GLUCOPHAGE; Dose: 1,000   mg; Refills: 0  ??? pantoprazole DR 40 mg tablet; Commonly known as: PROTONIX; Take 1 tablet   by mouth once daily; Quantity: 90 tablet; Refills: 0  ??? potassium chloride SR 20 mEq tablet; Commonly known as: K-DUR; Dose: 20   mEq; Refills: 0  ??? SITagliptin 100 mg Tab tablet; Commonly known as: JANUVIA; Dose: 100 mg;   Refills: 0     STOP taking these medications    ??? acetaminophen/lidocaine/antacid DS(#) 1:1:3 ; Commonly known as: GI   COCKTAIL; Replaced by: LIDO/ANTACID/APAP 1:3:1 SUSP (COMPOUND)  ??? atorvastatin 40 mg tablet; Commonly known as: LIPITOR  ??? carvediloL 25 mg tablet; Commonly known as: COREG  ??? furosemide 20 mg tablet; Commonly known as: LASIX       Return Appointments and Scheduled Appointments     Scheduled appointments:    Jun 26, 2019  3:00 PM CDT Return Patient with Dayle Points, MD  [facility] (Internal Medicine) [address]  [place] [address]  [phone]          Consults, Procedures, Diagnostics, Micro, Pathology   Consults: ID, Oncology and Palliative Care and GI  Surgical Procedures & Dates: None  Significant Diagnostic Studies, Micro and Procedures: endoscopy: ERCP: Biliary stent placed  Significant Pathology: none     Nutrition: Dietitian Documentation ICD-10 code E43: Chronic illness/Severe malnutrition      ( ) Energy intake: 75% or less of estimated energy requirement for 1 month or more, Weight loss: >10% x 6 months  Loss of Subcutaneous Fat: Yes Moderate Orbital  Muscle Wasting: Yes Moderate Clavicle, Deltoid, Temple, Intercostal  Edema: No      Malnutrition Interventions: Provided diet education to Pt and family      Discharge Disposition, Condition   Patient Disposition: Home with Home Health Care  Condition at Discharge: Stable    Code Status     Code Status History     Date Active Date Inactive Code Status Order ID          06/11/2019 1029 06/20/2019 1402 DNAR-Full Intervention [id number]  Docia Chuck, MD Inpatient    06/11/2019 0404 06/11/2019 1029 DNAR-Full Intervention [id number]  Ripp, Gerilyn Pilgrim, DO Inpatient    Only showing the last 2 code statuses.          Patient Instructions        CBC and Diff   Standing Status: Standing Number of Occurrences: 2 Standing Exp. Date: 07/19/19    Fax to Dr. Nadara Eaton, ID clinic, at [phone]     Comprehensive Metabolic Panel (CMP)   Standing Status: Standing Number of Occurrences: 2 Standing Exp. Date: 07/19/19    Fax to Dr. Nadara Eaton, ID clinic, at [phone]     Which provider would  you like to CC? CLOUGH, LISA A [[id number]]      CREATINE KINASE-CPK   Standing Status: Standing Number of Occurrences: 2 Standing Exp. Date: 07/19/19    Fax to Dr. Nadara Eaton, ID clinic, at [phone]     Regular Diet You have no dietary restriction. Please continue with a healthy balanced diet.    If you cannot tolerate a Regular diet due to pain after eating, can try a low fat diet.     Low Fat / Low Cholesterol Diet    Your goal is to limit the amount of saturated and trans fats in your diet. Keep track of how much cholesterol you eat and limit the total amount to 200mg  (milligrams) a day.      If you have questions about your diet after you go home, you can call a dietitian at [phone].     Testing Not Required for Covid-19     Testing Not Required for Covid-19     Report These Signs and Symptoms    Please contact your doctor if you have any of the following symptoms: temperature higher than 100.4 degrees F, uncontrolled pain, persistent nausea and/or vomiting, difficulty breathing, chest pain, severe abdominal pain, headache, unable to urinate, unable to have bowel movement or drainage with a foul odor     Questions About Your Stay    If you have an emergency after discharge, please dial 9-1-1.    You may contact your discharging physician up to 7 days after discharge for questions about your hospitalization, discharge instructions, or medications by calling [phone] during regular business hours (8AM-4PM) and asking to speak to the doctor listed on the discharge information.       If you are not calling during business hours, ask for the on-call doctor.    If you have new  or worsening symptoms, you may be directed to your primary care provider (PCP) for ongoing questions, an Emergency Department, or an Urgent Care Clinic for a more immediate evaluation.    For all calls or questions more than 7 days after discharge, please contact your primary care provider (PCP).    For medications after discharge: pain (opioid) medicine cannot be refilled or prescribed by calling your discharging physician.  These medications need to be filled by your primary care provider (PCP).  Regular refill requests should be directed to your primary care provider (PCP).     Discharging attending physician: Steffanie Dunn [[id number]]      Activity as Tolerated    It is important to keep increasing your activity level after you leave the hospital.  Moving around can help prevent blood clots, lung infection (pneumonia) and other problems.  Gradually increasing the number of times you are up moving around will help you return to your normal activity level more quickly.  Continue to increase the number of times you are up to the chair and walking daily to return to your normal activity level. Begin to work toward your normal activity level at discharge       Additional Orders: Case Management, Supplies, Home Health     Home Health/DME              HOME HEALTH/DME  ONCE     Comments:  Home Health/Durable Medical Equipment Order Details    Patient Name:  Dustin Blackwell                   Medical Record Number:   [id number]  Agency Instructions: [facility] (Phone: [phone]; Fax: [phone]) and [facility] (Phone: [phone]; Fax: [phone])     Start of care within 24 hours of hospital discharge. RN to complete general assessment, including vitals with temperature, monitor and teach patient and/or caregiver medication management and compliance, monitor and teach pain management and pain medication compliance when necessary. Monitor and teach signs and symptoms of infection, monitor and teach disease management, including signs and symptoms, diet, who and when to call, hospital readmission avoidance.    Physical therapy to evaluate and treat along with home safety evaluation. Teach strategies for energy conservation, mobility training, strengthening, balance activities and address deficits, maximize function and improve safety.    Home health agency to provide weekly Peripherally Inserted Central Catheter (PICC) care, lab draws, and assist patient and family with IV medication administration.    Patient has the following IV medication(s):  Drug: Daptomycin 500mg  - Dose 8mg /kg (weight 66.7 kg)  Frequency: Every 24 hours (Current dose schedule 0900)    Anticipate length of need will be for minimum of: 14 days. Anticipated stop date: 06/25/2019  Stop date to be determined by: Dr. Loreta Ave with [facility] (Phone: [phone]; Fax: [phone])  Line placed on: 06/18/2019  Line location: Right basilic single lumen PICC  Line size: 4 [place]. Inserted to 43 cm with 0 cm external.    Please monitor and maintain line per agency's protocol. Please change dressing every Monday. Infusion Company to provide Biopatch for dressing changes. Line can be de-accessed when antibiotics are completed - Please check with the following physician prior to de-accessing, Dr. Loreta Ave (Phone: [phone]). Please teach patient/family/caregivers IV management and line care.    Patient will need the following weekly labs: CBC w/diff, CMP, and CPK. Please draw labs on the following day(s)/date: on Monday, first draw on 06/25/2019.   Please fax results to:  Physician: Dr. Alona Bene   Fax# [phone]  ID Physician: Dr. Nadara Eaton   Fax# [phone]  Infusion Company: [facility]     Fax# [phone]    Infusion Agency to provide IV medications, supplies bio-patch, and perform instruction at bedside.     Clinical findings to support homebound status: Poor tolerance for activity and extreme weakness and/or fatigue.  Clinical findings to support home care services: Requires instructions/administration of injections/IV therapy and muscle weakness affecting functional activities.    I certify that this patient is under my care and that I, or a nurse practitioner or physician's assistant working with me, had a face-to-face encounter that meets the physician's face-to-face encounter requirements with this patient on 06/18/2019. This patient is under my care, and I have initiated the establishment of the plan of care.??? This patient will be followed by a physician after discharge, who will periodically review the plan of care.     Derryl Harbor, DO  NPI [id number]   Question Answer Comment   Attending Name/Contact Derryl Harbor, DO (Phone: [phone]; Fax: [phone])    PCP Name/Contact Wilford Grist, MD (Phone: [phone]; Fax: [phone])    Specialist Name/Contact Loreta Ave, MD (Phone: [phone]; Fax: [phone])    Home Health to Follow PCP                         Signed:  Steffanie Dunn, DO  06/21/2019      cc:  Primary Care Physician:  Wilford Grist   Verified  Referring physicians:  Isidor Holts, *  Additional provider(s):        Did we miss something? If additional records are needed, please fax a request on office letterhead to [phone]. Please include the patient's name, date of birth, fax number and type of information needed. Additional request can be made by email at [email or web address]. For general questions of information about electronic records sharing, call [phone].

## 2019-06-26 ENCOUNTER — Encounter: Admit: 2019-06-26 | Discharge: 2019-06-26

## 2019-06-26 NOTE — Telephone Encounter
Confirmed with Yetta Flock, RN at [facility] that patient is still admitted at [facility].

## 2019-06-27 ENCOUNTER — Encounter: Admit: 2019-06-27 | Discharge: 2019-06-27

## 2019-06-27 NOTE — Telephone Encounter
Left voicemail for patient informing him that he has been scheduled for a hospital follow up on 07/11/2019. Provided scheduling phone number.     APPOINTMENT REQUEST: [facility]) [id number]     Electronically signed by: Charm Rings, APRN on 06/18/19 [phone]  Status: Active    Ordering user: Charm Rings, [place] 06/18/19 [phone]  Ordering provider: Charm Rings, APRN    Authorized by: Charm Rings, APRN    Frequency: Once 06/18/19 0845 - 1 occurrence    Questionnaire     Question  Answer    Diagnosis:  colon cancer    Reason for outpatient app't request:  hospital follow up    Was patient seen in hospital by requested consult service?  Yes    Provider requesting to see:  Dr. Arma Heading    Time frame requested for the outpatient app't (provide range):  one week    Expected discharge date:  06/20/2019    Name of physician & email to notify of the scheduled appt:  [email or web address]    Pager # of the requesting provider if any questions?  [phone]

## 2019-07-03 ENCOUNTER — Encounter: Admit: 2019-07-03 | Discharge: 2019-07-03 | Payer: MEDICARE

## 2019-07-03 NOTE — Telephone Encounter
Spoke to [employer], Therapist, sports at [facility]. Patient completed his IV abx while he was admitted at [place] line was removed. Patient discharged from there on 9/13.

## 2019-07-11 ENCOUNTER — Encounter: Admit: 2019-07-11 | Discharge: 2019-07-11 | Payer: MEDICARE

## 2019-07-17 ENCOUNTER — Encounter

## 2019-07-17 DIAGNOSIS — Z1159 Encounter for screening for other viral diseases: Secondary | ICD-10-CM

## 2019-07-17 LAB — COVID-19 (SARS-COV-2) PCR

## 2019-07-17 NOTE — Telephone Encounter
Contacted patient and confirmed name and DOB. Patient advised that COVID-19 test results are negative. Advised that patient can continue with the procedure and should follow pre-procedure instructions. Advised patient to continue with home quarantine until procedure. Advised that if they develop any concerning symptoms prior to the procedure to contact their procedure team, specialist, and/or PCP for assistance.      , RN

## 2019-07-18 ENCOUNTER — Encounter: Admit: 2019-07-18 | Discharge: 2019-07-18 | Payer: MEDICARE

## 2019-07-18 ENCOUNTER — Ambulatory Visit: Admit: 2019-07-18 | Discharge: 2019-07-18 | Payer: MEDICARE

## 2019-07-18 DIAGNOSIS — I10 Essential (primary) hypertension: Secondary | ICD-10-CM

## 2019-07-18 DIAGNOSIS — M549 Dorsalgia, unspecified: Secondary | ICD-10-CM

## 2019-07-18 DIAGNOSIS — I509 Heart failure, unspecified: Secondary | ICD-10-CM

## 2019-07-18 DIAGNOSIS — K219 Gastro-esophageal reflux disease without esophagitis: Secondary | ICD-10-CM

## 2019-07-18 DIAGNOSIS — J449 Chronic obstructive pulmonary disease, unspecified: Secondary | ICD-10-CM

## 2019-07-18 DIAGNOSIS — E119 Type 2 diabetes mellitus without complications: Secondary | ICD-10-CM

## 2019-07-18 DIAGNOSIS — C259 Malignant neoplasm of pancreas, unspecified: Secondary | ICD-10-CM

## 2019-07-18 DIAGNOSIS — M1990 Unspecified osteoarthritis, unspecified site: Secondary | ICD-10-CM

## 2019-07-18 DIAGNOSIS — G4733 Obstructive sleep apnea (adult) (pediatric): Secondary | ICD-10-CM

## 2019-07-18 DIAGNOSIS — N400 Enlarged prostate without lower urinary tract symptoms: Secondary | ICD-10-CM

## 2019-07-18 LAB — POC GLUCOSE
Lab: 95 mg/dL (ref 70–100)
Lab: 98 mg/dL (ref 70–100)

## 2019-07-18 MED ORDER — ONDANSETRON HCL (PF) 4 MG/2 ML IJ SOLN
4 mg | Freq: Once | INTRAVENOUS | 0 refills | Status: CN | PRN
Start: 2019-07-18 — End: ?

## 2019-07-18 MED ORDER — LACTATED RINGERS IV SOLP
1000 mL | INTRAVENOUS | 0 refills | Status: DC
Start: 2019-07-18 — End: 2019-07-18
  Administered 2019-07-18: 13:00:00 1000 mL via INTRAVENOUS

## 2019-07-18 MED ORDER — DEXTRAN 70-HYPROMELLOSE (PF) 0.1-0.3 % OP DPET
0 refills | Status: DC
Start: 2019-07-18 — End: 2019-07-18
  Administered 2019-07-18: 14:00:00 1 [drp] via OPHTHALMIC

## 2019-07-18 MED ORDER — DEXAMETHASONE SODIUM PHOSPHATE 4 MG/ML IJ SOLN
INTRAVENOUS | 0 refills | Status: DC
Start: 2019-07-18 — End: 2019-07-18
  Administered 2019-07-18: 14:00:00 4 mg via INTRAVENOUS

## 2019-07-18 MED ORDER — CEFTRIAXONE 1 GRAM IJ SOLR
0 refills | Status: DC
Start: 2019-07-18 — End: 2019-07-18
  Administered 2019-07-18: 14:00:00 1 g via INTRAVENOUS

## 2019-07-18 MED ORDER — FENTANYL CITRATE (PF) 50 MCG/ML IJ SOLN
0 refills | Status: DC
Start: 2019-07-18 — End: 2019-07-18
  Administered 2019-07-18: 14:00:00 75 ug via INTRAVENOUS

## 2019-07-18 MED ORDER — FENTANYL CITRATE (PF) 50 MCG/ML IJ SOLN
50 ug | INTRAVENOUS | 0 refills | Status: CN | PRN
Start: 2019-07-18 — End: ?

## 2019-07-18 MED ORDER — SUCCINYLCHOLINE CHLORIDE 20 MG/ML IJ SOLN
INTRAVENOUS | 0 refills | Status: DC
Start: 2019-07-18 — End: 2019-07-18
  Administered 2019-07-18: 14:00:00 80 mg via INTRAVENOUS

## 2019-07-18 MED ORDER — IOPAMIDOL 61 % IV SOLN
0 refills | Status: DC
Start: 2019-07-18 — End: 2019-07-18
  Administered 2019-07-18: 15:00:00 20 mL via INTRAMUSCULAR

## 2019-07-18 MED ORDER — LIDOCAINE (PF) 200 MG/10 ML (2 %) IJ SYRG
0 refills | Status: DC
Start: 2019-07-18 — End: 2019-07-18
  Administered 2019-07-18: 14:00:00 60 mg via INTRAVENOUS

## 2019-07-18 MED ORDER — PROPOFOL INJ 10 MG/ML IV VIAL
0 refills | Status: DC
Start: 2019-07-18 — End: 2019-07-18
  Administered 2019-07-18: 14:00:00 80 mg via INTRAVENOUS

## 2019-07-18 MED ORDER — PHENYLEPHRINE IN 0.9% NACL(PF) 1 MG/10 ML (100 MCG/ML) IV SYRG
INTRAVENOUS | 0 refills | Status: DC
Start: 2019-07-18 — End: 2019-07-18
  Administered 2019-07-18: 14:00:00 100 ug via INTRAVENOUS
  Administered 2019-07-18: 14:00:00 200 ug via INTRAVENOUS
  Administered 2019-07-18 (×2): 100 ug via INTRAVENOUS

## 2019-07-18 MED ORDER — SODIUM CHLORIDE 0.9 % IR SOLN
0 refills | Status: DC
Start: 2019-07-18 — End: 2019-07-18
  Administered 2019-07-18: 15:00:00 20 mL

## 2019-07-18 MED ORDER — ONDANSETRON HCL (PF) 4 MG/2 ML IJ SOLN
INTRAVENOUS | 0 refills | Status: DC
Start: 2019-07-18 — End: 2019-07-18
  Administered 2019-07-18: 15:00:00 4 mg via INTRAVENOUS

## 2019-07-18 MED ORDER — EPHEDRINE SULFATE 50 MG/5ML SYR (10 MG/ML) (AN)(OSM)
0 refills | Status: DC
Start: 2019-07-18 — End: 2019-07-18
  Administered 2019-07-18 (×2): 10 mg via INTRAVENOUS

## 2019-07-19 ENCOUNTER — Encounter: Admit: 2019-07-19 | Discharge: 2019-07-19 | Payer: MEDICARE

## 2019-07-19 DIAGNOSIS — M549 Dorsalgia, unspecified: Secondary | ICD-10-CM

## 2019-07-19 DIAGNOSIS — I10 Essential (primary) hypertension: Secondary | ICD-10-CM

## 2019-07-19 DIAGNOSIS — E119 Type 2 diabetes mellitus without complications: Secondary | ICD-10-CM

## 2019-07-19 DIAGNOSIS — I509 Heart failure, unspecified: Secondary | ICD-10-CM

## 2019-07-19 DIAGNOSIS — G4733 Obstructive sleep apnea (adult) (pediatric): Secondary | ICD-10-CM

## 2019-07-19 DIAGNOSIS — C259 Malignant neoplasm of pancreas, unspecified: Secondary | ICD-10-CM

## 2019-07-19 DIAGNOSIS — K219 Gastro-esophageal reflux disease without esophagitis: Secondary | ICD-10-CM

## 2019-07-19 DIAGNOSIS — N400 Enlarged prostate without lower urinary tract symptoms: Secondary | ICD-10-CM

## 2019-07-19 DIAGNOSIS — J449 Chronic obstructive pulmonary disease, unspecified: Secondary | ICD-10-CM

## 2019-07-19 DIAGNOSIS — M1990 Unspecified osteoarthritis, unspecified site: Secondary | ICD-10-CM

## 2019-07-20 ENCOUNTER — Encounter: Admit: 2019-07-20 | Discharge: 2019-07-20 | Payer: MEDICARE

## 2019-07-20 NOTE — Telephone Encounter
Patients appointment has been scheduled on 07/24/19 @ 10:00am with Dr. Arma Heading. I left a detailed message for Patient about appointment times and location, and requested a return call for confirmation.         APPOINTMENT REQUEST: [facility] [place]) [id number]     Electronically signed by: Charm Rings, APRN on 06/18/19 [phone]  Status: Active    Ordering user: Charm Rings, [place] 06/18/19 [phone]  Ordering provider: Charm Rings, APRN    Authorized by: Charm Rings, APRN    Frequency: Once 06/18/19 0845 - 1 occurrence    Questionnaire     Question  Answer    Diagnosis:  colon cancer    Reason for outpatient app't request:  hospital follow up    Was patient seen in hospital by requested consult service?  Yes    Provider requesting to see:  Dr. Arma Heading    Time frame requested for the outpatient app't (provide range):  one week    Expected discharge date:  06/20/2019    Name of physician & email to notify of the scheduled appt:  [email or web address]    Pager # of the requesting provider if any questions?  [phone]

## 2019-07-30 ENCOUNTER — Encounter: Admit: 2019-07-30 | Discharge: 2019-07-30 | Payer: MEDICARE

## 2019-10-09 ENCOUNTER — Encounter: Admit: 2019-10-09 | Discharge: 2019-10-09 | Payer: MEDICARE

## 2019-10-09 DIAGNOSIS — C250 Malignant neoplasm of head of pancreas: Secondary | ICD-10-CM

## 2019-10-09 NOTE — Telephone Encounter
Received a call that pt was wanting an urgent referral to hospice. Sent message to social work.

## 2019-10-09 NOTE — Progress Notes
Plan: Hospice     Intervention:  SW notified by [facility] Newcomer RN pt is requesting hospice.  SW called and spoke with pt.  SW explained role and provided contact information.  Pt reported he would like to meet with a hospice agency this week.  Pt reported he is tired and becoming more weak.  SW explained hospice will provide pain management/comfort care, pt will receive nursing visits at home from hospice RN, have access to hospice nurse 24/7, and provide any DME if needed, and other supportive services (social work, chaplin).  SW answered all questions and provided support.  No other needs identified at this time.  SW will continue to provide support as needed.     Hospice orders obtained.  Orders/referral faxed to [facility] [place] [phone].  Fax confirmation received.     Tresa Garter, LMSW

## 2019-11-19 DEATH — deceased
